# Patient Record
Sex: Female | Born: 1970
Health system: Southern US, Community
[De-identification: ages and names within clinical notes are randomized; demographics above are authoritative.]

## PROBLEM LIST (undated history)

## (undated) DIAGNOSIS — F418 Other specified anxiety disorders: Secondary | ICD-10-CM

## (undated) DIAGNOSIS — Z8489 Family history of other specified conditions: Secondary | ICD-10-CM

## (undated) DIAGNOSIS — T7840XA Allergy, unspecified, initial encounter: Secondary | ICD-10-CM

## (undated) DIAGNOSIS — F329 Major depressive disorder, single episode, unspecified: Secondary | ICD-10-CM

## (undated) DIAGNOSIS — F419 Anxiety disorder, unspecified: Secondary | ICD-10-CM

## (undated) DIAGNOSIS — M797 Fibromyalgia: Secondary | ICD-10-CM

## (undated) DIAGNOSIS — F32A Depression, unspecified: Secondary | ICD-10-CM

## (undated) DIAGNOSIS — K219 Gastro-esophageal reflux disease without esophagitis: Secondary | ICD-10-CM

## (undated) DIAGNOSIS — G709 Myoneural disorder, unspecified: Secondary | ICD-10-CM

## (undated) DIAGNOSIS — G43909 Migraine, unspecified, not intractable, without status migrainosus: Secondary | ICD-10-CM

## (undated) HISTORY — DX: Myoneural disorder, unspecified: G70.9

## (undated) HISTORY — DX: Allergy, unspecified, initial encounter: T78.40XA

## (undated) HISTORY — DX: Major depressive disorder, single episode, unspecified: F32.9

## (undated) HISTORY — DX: Depression, unspecified: F32.A

## (undated) HISTORY — PX: OTHER SURGICAL HISTORY: SHX169

## (undated) HISTORY — PX: TUBAL LIGATION: SHX77

---

## 2017-01-23 ENCOUNTER — Encounter: Payer: Self-pay | Admitting: *Deleted

## 2017-01-23 ENCOUNTER — Ambulatory Visit
Admission: EM | Admit: 2017-01-23 | Discharge: 2017-01-23 | Disposition: A | Discharge status: Home / Self Care | Payer: Self-pay | Attending: Family Medicine | Admitting: Family Medicine

## 2017-01-23 DIAGNOSIS — R112 Nausea with vomiting, unspecified: Secondary | ICD-10-CM

## 2017-01-23 DIAGNOSIS — S161XXA Strain of muscle, fascia and tendon at neck level, initial encounter: Secondary | ICD-10-CM

## 2017-01-23 HISTORY — DX: Other specified anxiety disorders: F41.8

## 2017-01-23 HISTORY — DX: Gastro-esophageal reflux disease without esophagitis: K21.9

## 2017-01-23 MED ORDER — KETOROLAC TROMETHAMINE 60 MG/2ML IM SOLN
60.0000 mg | Freq: Once | INTRAMUSCULAR | Status: AC
  Administered 2017-01-23: 60 mg via INTRAMUSCULAR

## 2017-01-23 MED ORDER — TIZANIDINE HCL 4 MG PO CAPS: 4.0000 mg | ORAL_CAPSULE | Freq: Three times a day (TID) | ORAL | 0 refills | Status: DC

## 2017-01-23 MED ORDER — ONDANSETRON 8 MG PO TBDP: 8.0000 mg | ORAL_TABLET | Freq: Two times a day (BID) | ORAL | 0 refills | Status: DC

## 2017-01-23 MED ORDER — NAPROXEN 500 MG PO TABS
500.0000 mg | ORAL_TABLET | Freq: Two times a day (BID) | ORAL | 0 refills | Status: DC
Start: 2017-01-23 — End: 2017-06-16

## 2017-01-23 NOTE — ED Provider Notes (Signed)
[facility]    CSN: [id number] Arrival date & time: 01/23/17  1058     History   Chief Complaint Chief Complaint  Patient presents with  . Neck Pain  . Shoulder Pain    HPI Heather Costa is a 46 y.o. female.   HPI  Is a 46 year old female who presents with constant neck and shoulder pain for the last 2 weeks. He has no known injury. She her husband have recently moved here from [place] currently living in a camper on a campground. He has been down here now for 2 months. She does not think that sleeping in the camper is the cause of her neck and shoulder pain. He denies any upper extremity radicular symptoms. He has been diagnosed with fibromyalgia but was not taking the Lyrica due to financial concerns  In addition to the above she has been having nausea and vomiting for the last 2 weeks. She's had a bilateral tubal ligation in the past. Last episode of nausea and vomiting was yesterday. She states that he get very between 5 times a day to 1 times a day. She has a history of chronic diarrhea has had for years and has been worked up by GI in the past. Tries to maintain her hydration is much as possible.        Past Medical History:  Diagnosis Date  . Depression with anxiety   . GERD (gastroesophageal reflux disease)     There are no active problems to display for this patient.   Past Surgical History:  Procedure Laterality Date  . TUBAL LIGATION      OB History    No data available       Home Medications    Prior to Admission medications   Medication Sig Start Date End Date Taking? Authorizing Provider  busPIRone (BUSPAR) 10 MG tablet Take 10 mg by mouth 2 (two) times daily.   Yes [provider]  citalopram (CELEXA) 20 MG tablet Take 20 mg by mouth daily.   Yes [provider]  omeprazole (PRILOSEC) 20 MG capsule Take 20 mg by mouth daily.   Yes [provider]  naproxen (NAPROSYN) 500 MG tablet Take 1 tablet  (500 mg total) by mouth 2 (two) times daily with a meal. 01/23/17   Lutricia Feil, PA-C  ondansetron (ZOFRAN ODT) 8 MG disintegrating tablet Take 1 tablet (8 mg total) by mouth 2 (two) times daily. 01/23/17   Lutricia Feil, PA-C  tiZANidine (ZANAFLEX) 4 MG capsule Take 1 capsule (4 mg total) by mouth 3 (three) times daily. 01/23/17   Lutricia Feil, PA-C    Family History Family History  Problem Relation Age of Onset  . Healthy Mother   . Healthy Father     Social History Social History  Substance Use Topics  . Smoking status: Former Games developer  . Smokeless tobacco: Never Used  . Alcohol use Yes     Allergies   Sulfa antibiotics   Review of Systems Review of Systems  Constitutional: Positive for activity change. Negative for appetite change, chills, fatigue and fever.  Musculoskeletal: Positive for neck pain and neck stiffness.  All other systems reviewed and are negative.    Physical Exam Triage Vital Signs ED Triage Vitals  Enc Vitals Group     BP 01/23/17 1135 (!) 143/70     Pulse Rate 01/23/17 1135 (!) 58     Resp 01/23/17 1135 16     Temp 01/23/17  1135 98 F (36.7 C)     Temp Source 01/23/17 1135 Oral     SpO2 01/23/17 1135 99 %     Weight 01/23/17 1137 143 lb (64.9 kg)     Height 01/23/17 1137 4' 9.5" (1.461 m)     Head Circumference --      Peak Flow --      Pain Score 01/23/17 1137 9     Pain Loc --      Pain Edu? --      Excl. in GC? --    No data found.   Updated Vital Signs BP (!) 143/70 (BP Location: Left Arm)   Pulse (!) 58   Temp 98 F (36.7 C) (Oral)   Resp 16   Ht 4' 9.5" (1.461 m)   Wt 143 lb (64.9 kg)   LMP 01/21/2017   SpO2 99%   BMI 30.41 kg/m   Visual Acuity Right Eye Distance:   Left Eye Distance:   Bilateral Distance:    Right Eye Near:   Left Eye Near:    Bilateral Near:     Physical Exam  Constitutional: She is oriented to person, place, and time. She appears well-developed and well-nourished. No distress.    HENT:  Head: Normocephalic.  Eyes: Pupils are equal, round, and reactive to light.  Neck: Normal range of motion. Neck supple.  Examination of the cervical spine showed limitation of motion particularly to flexion and leftward flexion and extension. Patient is also decreased. She has tenderness to palpation along the paraspinous muscles and into the trapezii bilaterally. Upper extremity strength is intact to testing. Sensation is intact to light touch throughout. DTRs are 2+ over 4 in the upper extremities.  Musculoskeletal: Normal range of motion.  Lymphadenopathy:    She has no cervical adenopathy.  Neurological: She is alert and oriented to person, place, and time.  Skin: Skin is warm and dry. She is not diaphoretic.  Psychiatric: She has a normal mood and affect. Her behavior is normal. Judgment and thought content normal.  Nursing note and vitals reviewed.    UC Treatments / Results  Labs (all labs ordered are listed, but only abnormal results are displayed) Labs Reviewed - No data to display  EKG  EKG Interpretation None       Radiology No results found.  Procedures Procedures (including critical care time)  Medications Ordered in UC Medications  ketorolac (TORADOL) injection 60 mg (60 mg Intramuscular Given 01/23/17 1246)     Initial Impression / Assessment and Plan / UC Course  I have reviewed the triage vital signs and the nursing notes.  Pertinent labs & imaging results that were available during my care of the patient were reviewed by me and considered in my medical decision making (see chart for details).     Plan: 1. Test/x-ray results and diagnosis reviewed with patient 2. rx as per orders; risks, benefits, potential side effects reviewed with patient 3. Recommend supportive treatment with rest and symptom avoidance. Uses Zanaflex 4 hours muscle spasm. She was cautioned regarding activities requiring concentration and judgment and was warned not to  drive taking the muscle relaxer. Since she is new to the area she'll need to find a primary care physician as well as a GYN and GI physician. Given the names but should check with her insurance carrier so she doesn't incur out-of-pocket expenses. If she is not improving she should follow-up in our clinic or hopefully she'll obtain the name of a  primary care. 4. F/u prn if symptoms worsen or don't improve   Final Clinical Impressions(s) / UC Diagnoses   Final diagnoses:  Acute strain of neck muscle, initial encounter    New Prescriptions Discharge Medication List as of 01/23/2017 12:42 PM    START taking these medications   Details  naproxen (NAPROSYN) 500 MG tablet Take 1 tablet (500 mg total) by mouth 2 (two) times daily with a meal., Starting Mon 01/23/2017, Normal    ondansetron (ZOFRAN ODT) 8 MG disintegrating tablet Take 1 tablet (8 mg total) by mouth 2 (two) times daily., Starting Mon 01/23/2017, Normal    tiZANidine (ZANAFLEX) 4 MG capsule Take 1 capsule (4 mg total) by mouth 3 (three) times daily., Starting Mon 01/23/2017, Normal         Controlled Substance Prescriptions [place] Controlled Substance Registry consulted? Not Applicable   Lutricia Feil, PA-C 01/23/17 1307

## 2017-01-23 NOTE — ED Triage Notes (Signed)
Patient started having constant neck and shoulder pain 2 weeks ago. Mechanism of injury unknown.  Patient reports nausea and vomiting for 2 weeks, last episode yesterday. Patient reports constant diarrhea for years.

## 2017-04-21 ENCOUNTER — Ambulatory Visit (INDEPENDENT_AMBULATORY_CARE_PROVIDER_SITE_OTHER): Payer: 59 | Admitting: Family Medicine

## 2017-04-21 ENCOUNTER — Encounter: Payer: Self-pay | Admitting: Family Medicine

## 2017-04-21 VITALS — BP 131/85 | HR 88 | Temp 99.3°F | Ht 59.0 in | Wt 147.8 lb

## 2017-04-21 DIAGNOSIS — Z7689 Persons encountering health services in other specified circumstances: Secondary | ICD-10-CM | POA: Diagnosis not present

## 2017-04-21 DIAGNOSIS — M797 Fibromyalgia: Secondary | ICD-10-CM | POA: Diagnosis not present

## 2017-04-21 DIAGNOSIS — F419 Anxiety disorder, unspecified: Secondary | ICD-10-CM | POA: Diagnosis not present

## 2017-04-21 DIAGNOSIS — K582 Mixed irritable bowel syndrome: Secondary | ICD-10-CM

## 2017-04-21 DIAGNOSIS — F331 Major depressive disorder, recurrent, moderate: Secondary | ICD-10-CM | POA: Diagnosis not present

## 2017-04-21 MED ORDER — HYDROXYZINE HCL 25 MG PO TABS: 25.0000 mg | ORAL_TABLET | Freq: Three times a day (TID) | ORAL | 1 refills | Status: DC | PRN

## 2017-04-21 MED ORDER — PREGABALIN 25 MG PO CAPS: 25.0000 mg | ORAL_CAPSULE | Freq: Two times a day (BID) | ORAL | 0 refills | Status: DC

## 2017-04-21 MED ORDER — BACLOFEN 10 MG PO TABS: 10.0000 mg | ORAL_TABLET | Freq: Three times a day (TID) | ORAL | 1 refills | Status: DC | PRN

## 2017-04-21 MED ORDER — CITALOPRAM HYDROBROMIDE 20 MG PO TABS: 20.0000 mg | ORAL_TABLET | Freq: Every day | ORAL | 1 refills | Status: DC

## 2017-04-21 NOTE — Progress Notes (Signed)
BP 131/85 (BP Location: Left Arm, Patient Position: Sitting, Cuff Size: Normal)   Pulse 88   Temp 99.3 F (37.4 C) (Temporal)   Ht 4\' 11"  (1.499 m)   Wt 147 lb 12.8 oz (67 kg)   SpO2 98%   BMI 29.85 kg/m    Subjective:    Patient ID: Heather Levineammy Maddison, female    DOB: February 21, 1971, 47 y.o.   MRN: [id number]  HPI: Heather Costa is a 47 y.o. female  Chief Complaint  Patient presents with  . Establish Care  . Fibromyalgia    Patient states she was diagnosed with Fibromyalgia. Patient states she's in severe pain all the time.   . Bowel Problem    Patient states that she has had colonoscopy, endosocpy for bowel issues. Patient states she'll have days of diarrhea or constipation. Patient states she thinks it's her gallbladder.   Pt here today to establish care.   Diagnosed 3 years ago with Fibromyalgia, pain is severe the past year. States it's from the shoulder blades up. Flares are more frequent and longer lasting lately. Tried gabapentin right off after diagnosis and states it made her a zombie. Could not afford lyrica. Tried acupuncture with fair temporary relief.   Hx of depression, on celexa. Not on buspar but is supposed to be. States it didn't help her so she weaned off of it. Has been on celexa for about 2 years now, working well for her. Denies SI/HI. Still having significant anxiety issues, feels her pain aids in that.   Bouts of continuous diarrhea and then constipation. Has had full workup through GI in the last few years with upper endoscopy, colonoscopy, and many lab tests all without known cause. Currently, sh manages with stool softeners during constipation. Tries not to treat diarrhea as it brings on the constipation worse. Used to be careful about her diet but has not done so in quite a while.   Last CPE was about a year ago in April.   Past Medical History:  Diagnosis Date  . Allergy   . Depression   . Depression with anxiety   . GERD (gastroesophageal reflux  disease)   . Neuromuscular disorder [facility](HCC)    Social History   Socioeconomic History  . Marital status: Married    Spouse name: Not on file  . Number of children: Not on file  . Years of education: Not on file  . Highest education level: Not on file  Social Needs  . Financial resource strain: Not on file  . Food insecurity - worry: Not on file  . Food insecurity - inability: Not on file  . Transportation needs - medical: Not on file  . Transportation needs - non-medical: Not on file  Occupational History  . Not on file  Tobacco Use  . Smoking status: Current Every Day Smoker    Types: Cigarettes  . Smokeless tobacco: Never Used  Substance and Sexual Activity  . Alcohol use: Yes  . Drug use: No  . Sexual activity: Not on file  Other Topics Concern  . Not on file  Social History Narrative  . Not on file    Relevant past medical, surgical, family and social history reviewed and updated as indicated. Interim medical history since our last visit reviewed. Allergies and medications reviewed and updated.  Review of Systems  Constitutional: Negative.   HENT: Negative.   Respiratory: Negative.   Cardiovascular: Negative.   Gastrointestinal: Positive for abdominal pain, constipation and diarrhea.  Genitourinary:  Negative.   Musculoskeletal: Positive for arthralgias and myalgias.  Skin: Negative.   Neurological: Negative.   Psychiatric/Behavioral: Positive for dysphoric mood. The patient is nervous/anxious.    Per HPI unless specifically indicated above     Objective:    BP 131/85 (BP Location: Left Arm, Patient Position: Sitting, Cuff Size: Normal)   Pulse 88   Temp 99.3 F (37.4 C) (Temporal)   Ht 4\' 11"  (1.499 m)   Wt 147 lb 12.8 oz (67 kg)   SpO2 98%   BMI 29.85 kg/m   Wt Readings from Last 3 Encounters:  04/21/17 147 lb 12.8 oz (67 kg)  01/23/17 143 lb (64.9 kg)    Physical Exam  Constitutional: She is oriented to person, place, and time. She appears  well-developed and well-nourished. No distress.  HENT:  Head: Atraumatic.  Eyes: Conjunctivae are normal. Pupils are equal, round, and reactive to light.  Neck: Normal range of motion. Neck supple.  Cardiovascular: Normal rate and normal heart sounds.  Pulmonary/Chest: Effort normal and breath sounds normal.  Abdominal: Soft. Bowel sounds are normal. She exhibits no distension. There is no tenderness.  Musculoskeletal: Normal range of motion.  Neurological: She is alert and oriented to person, place, and time.  Skin: Skin is warm and dry.  Psychiatric: She has a normal mood and affect. Her behavior is normal.  Nursing note and vitals reviewed.  No results found for this or any previous visit.    Assessment & Plan:   Problem List Items Addressed This Visit      Other   Depression - Primary    Stable on celexa, continue current regimen      Relevant Medications   hydrOXYzine (ATARAX/VISTARIL) 25 MG tablet   citalopram (CELEXA) 20 MG tablet   Fibromyalgia    Will try getting lyrica covered again as she's failed gabapentin, acupuncture, OTC pain relievers, muscle relaxers. Discussed that we will transition her from celexa to cymbalta if not covered to try and target her neuropathic pain in addition to depression.       Anxiety    Pt open to trying hydroxyzine prn instead of the buspar as it didn't help her at all. Strongly recommended counseling in addition, list given      Relevant Medications   hydrOXYzine (ATARAX/VISTARIL) 25 MG tablet   citalopram (CELEXA) 20 MG tablet    Other Visit Diagnoses    Encounter to establish care       Irritable bowel syndrome with both constipation and diarrhea       Suspect her GI sxs are mixed IBS. probiotics, trials of cutting out certain food groups, learn triggers. Tx symptomatically. Pt not wanting to see GI currently       Follow up plan: Return in about 4 weeks (around 05/19/2017) for Fibromyalgia f/u, anxiety.

## 2017-04-24 DIAGNOSIS — M797 Fibromyalgia: Secondary | ICD-10-CM | POA: Insufficient documentation

## 2017-04-24 DIAGNOSIS — F419 Anxiety disorder, unspecified: Secondary | ICD-10-CM | POA: Insufficient documentation

## 2017-04-24 DIAGNOSIS — F329 Major depressive disorder, single episode, unspecified: Secondary | ICD-10-CM | POA: Insufficient documentation

## 2017-04-24 DIAGNOSIS — F32A Depression, unspecified: Secondary | ICD-10-CM | POA: Insufficient documentation

## 2017-04-24 NOTE — Assessment & Plan Note (Signed)
Pt open to trying hydroxyzine prn instead of the buspar as it didn't help her at all. Strongly recommended counseling in addition, list given

## 2017-04-24 NOTE — Assessment & Plan Note (Signed)
Will try getting lyrica covered again as she's failed gabapentin, acupuncture, OTC pain relievers, muscle relaxers. Discussed that we will transition her from celexa to cymbalta if not covered to try and target her neuropathic pain in addition to depression.

## 2017-04-24 NOTE — Assessment & Plan Note (Signed)
Stable on celexa, continue current regimen 

## 2017-04-24 NOTE — Patient Instructions (Signed)
Follow up in 1 month   

## 2017-05-01 ENCOUNTER — Encounter: Payer: Self-pay | Admitting: Family Medicine

## 2017-05-02 ENCOUNTER — Other Ambulatory Visit: Payer: Self-pay | Admitting: Family Medicine

## 2017-05-02 MED ORDER — METHOCARBAMOL 500 MG PO TABS: 500.0000 mg | ORAL_TABLET | Freq: Three times a day (TID) | ORAL | 0 refills | Status: DC

## 2017-05-19 ENCOUNTER — Ambulatory Visit (INDEPENDENT_AMBULATORY_CARE_PROVIDER_SITE_OTHER): Payer: 59 | Admitting: Family Medicine

## 2017-05-19 ENCOUNTER — Encounter: Payer: Self-pay | Admitting: Family Medicine

## 2017-05-19 VITALS — BP 134/84 | HR 83 | Wt 144.8 lb

## 2017-05-19 DIAGNOSIS — M797 Fibromyalgia: Secondary | ICD-10-CM

## 2017-05-19 DIAGNOSIS — F419 Anxiety disorder, unspecified: Secondary | ICD-10-CM | POA: Diagnosis not present

## 2017-05-19 DIAGNOSIS — G43109 Migraine with aura, not intractable, without status migrainosus: Secondary | ICD-10-CM

## 2017-05-19 DIAGNOSIS — F331 Major depressive disorder, recurrent, moderate: Secondary | ICD-10-CM

## 2017-05-19 MED ORDER — SUMATRIPTAN SUCCINATE 50 MG PO TABS: 50.0000 mg | ORAL_TABLET | ORAL | 0 refills | Status: DC | PRN

## 2017-05-19 MED ORDER — PREGABALIN 25 MG PO CAPS: 25.0000 mg | ORAL_CAPSULE | Freq: Two times a day (BID) | ORAL | 0 refills | Status: DC

## 2017-05-19 MED ORDER — CLONAZEPAM 0.5 MG PO TABS: 0.5000 mg | ORAL_TABLET | Freq: Every day | ORAL | 0 refills | Status: DC | PRN

## 2017-05-19 MED ORDER — CITALOPRAM HYDROBROMIDE 40 MG PO TABS: 40.0000 mg | ORAL_TABLET | Freq: Every day | ORAL | 1 refills | Status: DC

## 2017-05-19 NOTE — Progress Notes (Signed)
BP 134/84   Pulse 83   Wt 144 lb 12.8 oz (65.7 kg)   SpO2 98%   BMI 29.25 kg/m    Subjective:    [name], female    DOB: [birth year], 47 y.o.   MRN: [id number]  HPI: Heather Costa is a 47 y.o. female  Chief Complaint  Patient presents with  . Follow-up  . Fibromyalgia    Same  . Anxiety    Same.    Pt here today for 1 month f/u fibromyalgia, depression, and anxiety. Feels her celexa is going well as far as her dysphoric moods. No SI/HI, feels it's working much better than the other anti depressants she's been on in the past.   Never got to start the lyrica for her fibromyalgia, was unsure if she was waiting for insurance approval or what. Still having significant pain from that. Has now failed cymbalta, amitriptyline, and gabapentin. Does feel the robaxin is the most successful muscle relaxer she's been on to date, but really hoping to try the lyrica. Is not interested in getting on pain medication and hoping to avoid altogether.   Feels the hydroxyzine did not touch her chronic anxiety and panic episodes. Has now failed buspar and hydroxyzine.    Having severe migraines occasionally, used to be on an abortive medications but can't remember what. Severe migraines over weekend and into Monday and Tuesday. Visual issus with them, but no N/V. States they run in the family.   Past Medical History:  Diagnosis Date  . Allergy   . Depression   . Depression with anxiety   . GERD (gastroesophageal reflux disease)   . Neuromuscular disorder [facility])    Social History   Socioeconomic History  . Marital status: Married    Spouse name: Not on file  . Number of children: Not on file  . Years of education: Not on file  . Highest education level: Not on file  Social Needs  . Financial resource strain: Not on file  . Food insecurity - worry: Not on file  . Food insecurity - inability: Not on file  . Transportation needs - medical: Not on file  . Transportation  needs - non-medical: Not on file  Occupational History  . Not on file  Tobacco Use  . Smoking status: Current Every Day Smoker    Types: Cigarettes  . Smokeless tobacco: Never Used  Substance and Sexual Activity  . Alcohol use: Yes  . Drug use: No  . Sexual activity: Not on file  Other Topics Concern  . Not on file  Social History Narrative  . Not on file   Relevant past medical, surgical, family and social history reviewed and updated as indicated. Interim medical history since our last visit reviewed. Allergies and medications reviewed and updated.  Review of Systems  Constitutional: Negative.   HENT: Negative.   Eyes: Positive for visual disturbance (during migraines).  Respiratory: Negative.   Cardiovascular: Negative.   Gastrointestinal: Negative.   Genitourinary: Negative.   Musculoskeletal: Positive for arthralgias and myalgias.  Skin: Negative.   Neurological: Positive for headaches.  Psychiatric/Behavioral: Positive for dysphoric mood. The patient is nervous/anxious.    Per HPI unless specifically indicated above     Objective:    BP 134/84   Pulse 83   Wt 144 lb 12.8 oz (65.7 kg)   SpO2 98%   BMI 29.25 kg/m   Wt Readings from Last 3 Encounters:  05/19/17 144 lb 12.8 oz (65.7  kg)  04/21/17 147 lb 12.8 oz (67 kg)  01/23/17 143 lb (64.9 kg)    Physical Exam  Constitutional: She is oriented to person, place, and time. She appears well-developed and well-nourished. No distress.  HENT:  Head: Atraumatic.  Eyes: Conjunctivae are normal. Pupils are equal, round, and reactive to light.  Neck: Normal range of motion. Neck supple.  Pulmonary/Chest: Breath sounds normal. No respiratory distress.  Musculoskeletal: Normal range of motion.  Neurological: She is alert and oriented to person, place, and time.  Skin: Skin is warm and dry.  Psychiatric: She has a normal mood and affect. Her behavior is normal. Judgment and thought content normal.  Nursing note and  vitals reviewed.  No results found for this or any previous visit.    Assessment & Plan:   Problem List Items Addressed This Visit      Cardiovascular and Mediastinum   Migraine    Will send imitrex to take at first sign of migraine coming on and get records from previous provider to get the name of her previous regimen which seemed to work well for her. Hoping also once her pain is under better control these will be less common.       Relevant Medications   pregabalin (LYRICA) 25 MG capsule   clonazePAM (KLONOPIN) 0.5 MG tablet   citalopram (CELEXA) 40 MG tablet   SUMAtriptan (IMITREX) 50 MG tablet     Other   Depression    Good control with celexa, will increase to 40 mg and see if this helps some with her difficult to control long-time anxiety. Pt refusing counseling, states it has never helped her in the past. Will monitor closely for benefit      Relevant Medications   citalopram (CELEXA) 40 MG tablet   Fibromyalgia    Will resend lyrica and look into insurance coverage/prior authorization to get that covered. Continue robaxin as needed      Anxiety - Primary    Has had good relief in the past with just prn klonopin, given her failure of multiple other types of anxiety medications will restart rare use of klonopin. Hoping 1 script will last 2+ months. Precautions reviewed at length with pt, who is agreeable      Relevant Medications   citalopram (CELEXA) 40 MG tablet       Follow up plan: Return in about 4 weeks (around 06/16/2017) for Anxiety f/u.

## 2017-05-22 DIAGNOSIS — G43909 Migraine, unspecified, not intractable, without status migrainosus: Secondary | ICD-10-CM | POA: Insufficient documentation

## 2017-05-22 NOTE — Assessment & Plan Note (Signed)
Has had good relief in the past with just prn klonopin, given her failure of multiple other types of anxiety medications will restart rare use of klonopin. Hoping 1 script will last 2+ months. Precautions reviewed at length with pt, who is agreeable

## 2017-05-22 NOTE — Patient Instructions (Signed)
Follow up in 1 month   

## 2017-05-22 NOTE — Assessment & Plan Note (Signed)
Will send imitrex to take at first sign of migraine coming on and get records from previous provider to get the name of her previous regimen which seemed to work well for her. Hoping also once her pain is under better control these will be less common.

## 2017-05-22 NOTE — Assessment & Plan Note (Addendum)
Will resend lyrica and look into insurance coverage/prior authorization to get that covered. Continue robaxin as needed

## 2017-05-22 NOTE — Assessment & Plan Note (Signed)
Good control with celexa, will increase to 40 mg and see if this helps some with her difficult to control long-time anxiety. Pt refusing counseling, states it has never helped her in the past. Will monitor closely for benefit

## 2017-05-23 ENCOUNTER — Telehealth: Payer: Self-pay

## 2017-05-23 NOTE — Telephone Encounter (Signed)
PA for Lyrica. Patient must first try and fail Duloxetine and Gabapentin.

## 2017-05-23 NOTE — Telephone Encounter (Signed)
Pt has failed cymbalta, amitriptyline, and gabapentin in the past

## 2017-05-23 NOTE — Telephone Encounter (Signed)
Okay! Thank you!  They can't find matching DOB/Name in system so I can complete a PA, but that is what they told me was needed for approval.   I will call patient and see what if there's another name/maiden name etc.  Please route back to me so it's in my box.

## 2017-05-25 NOTE — Telephone Encounter (Signed)
Tried calling patient,  No answer. Will try again later.

## 2017-05-31 NOTE — Telephone Encounter (Signed)
Phone went to VM this time. Left VM (DPR reviewed) about information below. Told patient to call us with another insurance card, or if her insurance has changed for PA.  Ok for CRM to give information if patient calls back.

## 2017-06-16 ENCOUNTER — Encounter: Payer: Self-pay | Admitting: Family Medicine

## 2017-06-16 ENCOUNTER — Ambulatory Visit (INDEPENDENT_AMBULATORY_CARE_PROVIDER_SITE_OTHER): Payer: 59 | Admitting: Family Medicine

## 2017-06-16 VITALS — BP 126/79 | HR 71 | Temp 97.9°F | Wt 145.1 lb

## 2017-06-16 DIAGNOSIS — M797 Fibromyalgia: Secondary | ICD-10-CM | POA: Diagnosis not present

## 2017-06-16 DIAGNOSIS — Z23 Encounter for immunization: Secondary | ICD-10-CM | POA: Diagnosis not present

## 2017-06-16 DIAGNOSIS — F330 Major depressive disorder, recurrent, mild: Secondary | ICD-10-CM | POA: Diagnosis not present

## 2017-06-16 DIAGNOSIS — F419 Anxiety disorder, unspecified: Secondary | ICD-10-CM | POA: Diagnosis not present

## 2017-06-16 MED ORDER — OMEPRAZOLE 40 MG PO CPDR: 40.0000 mg | DELAYED_RELEASE_CAPSULE | Freq: Every day | ORAL | 1 refills | Status: DC

## 2017-06-16 MED ORDER — METHOCARBAMOL 500 MG PO TABS: 500.0000 mg | ORAL_TABLET | Freq: Three times a day (TID) | ORAL | 1 refills | Status: DC

## 2017-06-16 MED ORDER — NAPROXEN 500 MG PO TABS: 500.0000 mg | ORAL_TABLET | Freq: Two times a day (BID) | ORAL | 3 refills | Status: DC

## 2017-06-16 NOTE — Progress Notes (Signed)
BP 126/79 (BP Location: Left Arm, Patient Position: Sitting, Cuff Size: Normal)   Pulse 71   Temp 97.9 F (36.6 C) (Oral)   Wt 145 lb 1.6 oz (65.8 kg)   SpO2 98%   BMI 29.31 kg/m    Subjective:    [name], female    DOB: November 13, 1970, 47 y.o.   MRN: [id number]  HPI: Heather Costa is a 47 y.o. female  Chief Complaint  Patient presents with  . Anxiety    Patient states she is much better. Celexa seems to helping.   Pt presents today for anxiety and fibromyalgia f/u.   Feeling great with current celexa dose, states it helps significantly. Has been able to use her klonopin very sparingly since increasing celexa dose. Still has 22 of 30 left from original script. Denies SI/HI, side effects, manic episodes. Very happy with where things are at in that regard.  Still having significant pain from fibromyalgia, continues to await insurance approval for lyrica as she's failed the other possible tx's. Robaxin helping minimally in meantime.   Past Medical History:  Diagnosis Date  . Allergy   . Depression   . Depression with anxiety   . GERD (gastroesophageal reflux disease)   . Neuromuscular disorder [facility])    Social History   Socioeconomic History  . Marital status: Married    Spouse name: Not on file  . Number of children: Not on file  . Years of education: Not on file  . Highest education level: Not on file  Social Needs  . Financial resource strain: Not on file  . Food insecurity - worry: Not on file  . Food insecurity - inability: Not on file  . Transportation needs - medical: Not on file  . Transportation needs - non-medical: Not on file  Occupational History  . Not on file  Tobacco Use  . Smoking status: Current Every Day Smoker    Types: Cigarettes  . Smokeless tobacco: Never Used  Substance and Sexual Activity  . Alcohol use: Yes  . Drug use: No  . Sexual activity: Not on file  Other Topics Concern  . Not on file  Social History  Narrative  . Not on file    Relevant past medical, surgical, family and social history reviewed and updated as indicated. Interim medical history since our last visit reviewed. Allergies and medications reviewed and updated.  Review of Systems  Per HPI unless specifically indicated above     Objective:    BP 126/79 (BP Location: Left Arm, Patient Position: Sitting, Cuff Size: Normal)   Pulse 71   Temp 97.9 F (36.6 C) (Oral)   Wt 145 lb 1.6 oz (65.8 kg)   SpO2 98%   BMI 29.31 kg/m   Wt Readings from Last 3 Encounters:  06/16/17 145 lb 1.6 oz (65.8 kg)  05/19/17 144 lb 12.8 oz (65.7 kg)  04/21/17 147 lb 12.8 oz (67 kg)    Physical Exam  Constitutional: She is oriented to person, place, and time. She appears well-developed and well-nourished. No distress.  HENT:  Head: Atraumatic.  Eyes: Conjunctivae are normal. Pupils are equal, round, and reactive to light.  Neck: Normal range of motion. Neck supple.  Cardiovascular: Normal rate and normal heart sounds.  Pulmonary/Chest: Effort normal and breath sounds normal. No respiratory distress.  Musculoskeletal: Normal range of motion.  Neurological: She is alert and oriented to person, place, and time.  Skin: Skin is warm and dry.  Psychiatric: She  has a normal mood and affect. Her behavior is normal.  Nursing note and vitals reviewed.  No results found for this or any previous visit.    Assessment & Plan:   Problem List Items Addressed This Visit      Other   Depression    Significant improvement with celexa at 40 mg. Continue current regimen      Fibromyalgia - Primary    Still awaiting approval for lyrica. Will continue to look into that. In meantime, continue robaxin, restart naproxen BID prn      Anxiety    Stable on prn klonopin. Continue current regimen.        Other Visit Diagnoses    Need for Td vaccine       Relevant Orders   Td vaccine greater than or equal to 7yo preservative free IM (Completed)         Follow up plan: Return for CPE.

## 2017-06-16 NOTE — Patient Instructions (Addendum)
Td Vaccine (Tetanus and Diphtheria): What You Need to Know 1. Why get vaccinated? Tetanus  and diphtheria are very serious diseases. They are rare in the [place] today, but people who do become infected often have severe complications. Td vaccine is used to protect adolescents and adults from both of these diseases. Both tetanus and diphtheria are infections caused by bacteria. Diphtheria spreads from person to person through coughing or sneezing. Tetanus-causing bacteria enter the body through cuts, scratches, or wounds. TETANUS (lockjaw) causes painful muscle tightening and stiffness, usually all over the body.  It can lead to tightening of muscles in the head and neck so you can't open your mouth, swallow, or sometimes even breathe. Tetanus kills about 1 out of every 10 people who are infected even after receiving the best medical care.  DIPHTHERIA can cause a thick coating to form in the back of the throat.  It can lead to breathing problems, paralysis, heart failure, and death.  Before vaccines, as many as 200,000 cases of diphtheria and hundreds of cases of tetanus were reported in the [place] each year. Since vaccination began, reports of cases for both diseases have dropped by about 99%. 2. Td vaccine Td vaccine can protect adolescents and adults from tetanus and diphtheria. Td is usually given as a booster dose every 10 years but it can also be given earlier after a severe and dirty wound or burn. Another vaccine, called Tdap, which protects against pertussis in addition to tetanus and diphtheria, is sometimes recommended instead of Td vaccine. Your doctor or the person giving you the vaccine can give you more information. Td may safely be given at the same time as other vaccines. 3. Some people should not get this vaccine  A person who has ever had a life-threatening allergic reaction after a previous dose of any tetanus or diphtheria containing vaccine, OR has a severe  allergy to any part of this vaccine, should not get Td vaccine. Tell the person giving the vaccine about any severe allergies.  Talk to your doctor if you: ? had severe pain or swelling after any vaccine containing diphtheria or tetanus, ? ever had a condition called Guillain Barre Syndrome (GBS), ? aren't feeling well on the day the shot is scheduled. 4. What are the risks from Td vaccine? With any medicine, including vaccines, there is a chance of side effects. These are usually mild and go away on their own. Serious reactions are also possible but are rare. Most people who get Td vaccine do not have any problems with it. Mild problems following Td vaccine: (Did not interfere with activities)  Pain where the shot was given (about 8 people in 10)  Redness or swelling where the shot was given (about 1 person in 4)  Mild fever (rare)  Headache (about 1 person in 4)  Tiredness (about 1 person in 4)  Moderate problems following Td vaccine: (Interfered with activities, but did not require medical attention)  Fever over 102F (rare)  Severe problems following Td vaccine: (Unable to perform usual activities; required medical attention)  Swelling, severe pain, bleeding and/or redness in the arm where the shot was given (rare).  Problems that could happen after any vaccine:  People sometimes faint after a medical procedure, including vaccination. Sitting or lying down for about 15 minutes can help prevent fainting, and injuries caused by a fall. Tell your doctor if you feel dizzy, or have vision changes or ringing in the ears.  Some people get   severe pain in the shoulder and have difficulty moving the arm where a shot was given. This happens very rarely.  Any medication can cause a severe allergic reaction. Such reactions from a vaccine are very rare, estimated at fewer than 1 in a million doses, and would happen within a few minutes to a few hours after the vaccination. As with any  medicine, there is a very remote chance of a vaccine causing a serious injury or death. The safety of vaccines is always being monitored. For more information, visit: www.cdc.gov/vaccinesafety/ 5. What if there is a serious reaction? What should I look for? Look for anything that concerns you, such as signs of a severe allergic reaction, very high fever, or unusual behavior. Signs of a severe allergic reaction can include hives, swelling of the face and throat, difficulty breathing, a fast heartbeat, dizziness, and weakness. These would usually start a few minutes to a few hours after the vaccination. What should I do?  If you think it is a severe allergic reaction or other emergency that can't wait, call 9-1-1 or get the person to the nearest hospital. Otherwise, call your doctor.  Afterward, the reaction should be reported to the Vaccine Adverse Event Reporting System (VAERS). Your doctor might file this report, or you can do it yourself through the VAERS web site at www.vaers.hhs.gov, or by calling 1-800-822-7967. ? VAERS does not give medical advice. 6. The National Vaccine Injury Compensation Program The National Vaccine Injury Compensation Program (VICP) is a federal program that was created to compensate people who may have been injured by certain vaccines. Persons who believe they may have been injured by a vaccine can learn about the program and about filing a claim by calling 1-800-338-2382 or visiting the VICP website at www.hrsa.gov/vaccinecompensation. There is a time limit to file a claim for compensation. 7. How can I learn more?  Ask your doctor. He or she can give you the vaccine package insert or suggest other sources of information.  Call your local or state health department.  Contact the Centers for Disease Control and Prevention (CDC): ? Call 1-800-232-4636 (1-800-CDC-INFO) ? Visit CDC's website at www.cdc.gov/vaccines CDC Td Vaccine VIS (07/28/15) This information is  not intended to replace advice given to you by your health care provider. Make sure you discuss any questions you have with your health care provider. Document Released: 01/30/2006 Document Revised: 12/24/2015 Document Reviewed: 12/24/2015 Elsevier Interactive Patient Education  2017 Elsevier Inc.  

## 2017-06-19 NOTE — Assessment & Plan Note (Signed)
Significant improvement with celexa at 40 mg. Continue current regimen

## 2017-06-19 NOTE — Assessment & Plan Note (Signed)
Stable on prn klonopin. Continue current regimen.

## 2017-06-19 NOTE — Assessment & Plan Note (Signed)
Still awaiting approval for lyrica. Will continue to look into that. In meantime, continue robaxin, restart naproxen BID prn

## 2017-08-16 ENCOUNTER — Encounter: Payer: 59 | Admitting: Family Medicine

## 2017-08-17 ENCOUNTER — Encounter: Payer: Self-pay | Admitting: Family Medicine

## 2017-08-17 ENCOUNTER — Ambulatory Visit (INDEPENDENT_AMBULATORY_CARE_PROVIDER_SITE_OTHER): Payer: 59 | Admitting: Family Medicine

## 2017-08-17 VITALS — BP 127/85 | HR 82 | Temp 98.5°F | Ht 59.0 in | Wt 141.4 lb

## 2017-08-17 DIAGNOSIS — F419 Anxiety disorder, unspecified: Secondary | ICD-10-CM

## 2017-08-17 DIAGNOSIS — G43109 Migraine with aura, not intractable, without status migrainosus: Secondary | ICD-10-CM

## 2017-08-17 DIAGNOSIS — M797 Fibromyalgia: Secondary | ICD-10-CM | POA: Diagnosis not present

## 2017-08-17 DIAGNOSIS — Z0001 Encounter for general adult medical examination with abnormal findings: Secondary | ICD-10-CM

## 2017-08-17 DIAGNOSIS — Z0000 Encounter for general adult medical examination without abnormal findings: Secondary | ICD-10-CM

## 2017-08-17 DIAGNOSIS — Z114 Encounter for screening for human immunodeficiency virus [HIV]: Secondary | ICD-10-CM

## 2017-08-17 DIAGNOSIS — Z124 Encounter for screening for malignant neoplasm of cervix: Secondary | ICD-10-CM

## 2017-08-17 DIAGNOSIS — F330 Major depressive disorder, recurrent, mild: Secondary | ICD-10-CM

## 2017-08-17 DIAGNOSIS — Z1239 Encounter for other screening for malignant neoplasm of breast: Secondary | ICD-10-CM

## 2017-08-17 LAB — UA/M W/RFLX CULTURE, ROUTINE
Bilirubin, UA: NEGATIVE
Glucose, UA: NEGATIVE
KETONES UA: NEGATIVE
Leukocytes, UA: NEGATIVE
NITRITE UA: NEGATIVE
Protein, UA: NEGATIVE
RBC UA: NEGATIVE
UUROB: 0.2 mg/dL (ref 0.2–1.0)
pH, UA: 5 (ref 5.0–7.5)

## 2017-08-17 MED ORDER — QUETIAPINE FUMARATE 50 MG PO TABS: 50.0000 mg | ORAL_TABLET | Freq: Every day | ORAL | 0 refills | Status: DC

## 2017-08-17 MED ORDER — CLONAZEPAM 0.5 MG PO TABS: 0.5000 mg | ORAL_TABLET | Freq: Every day | ORAL | 0 refills | Status: DC | PRN

## 2017-08-17 MED ORDER — GABAPENTIN 300 MG PO CAPS: 300.0000 mg | ORAL_CAPSULE | Freq: Three times a day (TID) | ORAL | 1 refills | Status: DC

## 2017-08-17 NOTE — Progress Notes (Signed)
BP 127/85   Pulse 82   Temp 98.5 F (36.9 C) (Oral)   Ht  (1.499 m)   Wt 141 lb 6.4 oz (64.1 kg)   LMP 08/04/2017 (Exact Date)   SpO2 98%   BMI 28.56 kg/m    Subjective:    [name], female    DOB: 10-05-1970, 47 y.o.   MRN: [id number]  HPI: Heather Costa is a 47 y.o. female presenting on 08/17/2017 for comprehensive medical examination. Current medical complaints include:see below  Main concern today is still hasn't been able to get insurance approval for her lyrica and having significant fibromyalgia pain. As stated in prior notes, has tried and failed amitriptyline, gabapentin, cymbalta in the past and only currently on muscle relaxers and naproxen which help mildly. Wanting something to help with her pain while the lyrica gets sorted.  Still has 6 klonopin left, taking only in severe anxiety situations. Currently having some marital issues so her moods and anxiety have been worse than normal and she's not sleeping as well as she used to. Denies SI/HI, severe mood swings.   She currently lives with: husband Menopausal Symptoms: no  Depression Screen done today and results listed below:  Depression screen [facility] 2/9 08/17/2017  Decreased Interest 0  Down, Depressed, Hopeless 1  PHQ - 2 Score 1  Altered sleeping 1  Tired, decreased energy 1  Change in appetite 1  Feeling bad or failure about yourself  0  Trouble concentrating 0  Moving slowly or fidgety/restless 1  Suicidal thoughts 0  PHQ-9 Score 5    The patient does not have a history of falls. I did not complete a risk assessment for falls. A plan of care for falls was not documented.   Past Medical History:  Past Medical History:  Diagnosis Date  . Allergy   . Depression   . Depression with anxiety   . GERD (gastroesophageal reflux disease)   . Neuromuscular disorder [facility])     Surgical History:  Past Surgical History:  Procedure Laterality Date  . Dysplasia Procedures    . TUBAL  LIGATION      Medications:  Current Outpatient Medications on File Prior to Visit  Medication Sig  . citalopram (CELEXA) 40 MG tablet Take 1 tablet (40 mg total) by mouth daily.  . methocarbamol (ROBAXIN) 500 MG tablet Take 1 tablet (500 mg total) by mouth 3 (three) times daily.  . naproxen (NAPROSYN) 500 MG tablet Take 1 tablet (500 mg total) by mouth 2 (two) times daily with a meal.  . omeprazole (PRILOSEC) 40 MG capsule Take 1 capsule (40 mg total) by mouth daily.  . SUMAtriptan (IMITREX) 50 MG tablet Take 1 tablet (50 mg total) by mouth every 2 (two) hours as needed for migraine. May repeat in 2 hours if headache persists or recurs.  . pregabalin (LYRICA) 25 MG capsule Take 1 capsule (25 mg total) by mouth 2 (two) times daily. (Patient not taking: Reported on 06/16/2017)   No current facility-administered medications on file prior to visit.     Allergies:  Allergies  Allergen Reactions  . Sulfa Antibiotics Anaphylaxis    Social History:  Social History   Socioeconomic History  . Marital status: Married    Spouse name: Not on file  . Number of children: Not on file  . Years of education: Not on file  . Highest education level: Not on file  Occupational History  . Not on file  Social Needs  .  Financial resource strain: Not on file  . Food insecurity:    Worry: Not on file    Inability: Not on file  . Transportation needs:    Medical: Not on file    Non-medical: Not on file  Tobacco Use  . Smoking status: Current Every Day Smoker    Packs/day: 1.00    Types: Cigarettes  . Smokeless tobacco: Never Used  Substance and Sexual Activity  . Alcohol use: Yes  . Drug use: No  . Sexual activity: Not on file  Lifestyle  . Physical activity:    Days per week: Not on file    Minutes per session: Not on file  . Stress: Not on file  Relationships  . Social connections:    Talks on phone: Not on file    Gets together: Not on file    Attends religious service: Not on file     Active member of club or organization: Not on file    Attends meetings of clubs or organizations: Not on file    Relationship status: Not on file  . Intimate partner violence:    Fear of current or ex partner: Not on file    Emotionally abused: Not on file    Physically abused: Not on file    Forced sexual activity: Not on file  Other Topics Concern  . Not on file  Social History Narrative  . Not on file   Social History   Tobacco Use  Smoking Status Current Every Day Smoker  . Packs/day: 1.00  . Types: Cigarettes  Smokeless Tobacco Never Used   Social History   Substance and Sexual Activity  Alcohol Use Yes    Family History:  Family History  Problem Relation Age of Onset  . Healthy Mother   . Healthy Father   . Leukemia Paternal Uncle   . Emphysema Paternal Grandmother   . Emphysema Paternal Grandfather     Past medical history, surgical history, medications, allergies, family history and social history reviewed with patient today and changes made to appropriate areas of the chart.   Review of Systems - General ROS: negative Psychological ROS: positive for - anxiety, depression and sleep disturbances Ophthalmic ROS: negative ENT ROS: negative Breast ROS: negative for breast lumps Respiratory ROS: no cough, shortness of breath, or wheezing Cardiovascular ROS: no chest pain or dyspnea on exertion Gastrointestinal ROS: no abdominal pain, change in bowel habits, or black or bloody stools Genito-Urinary ROS: no dysuria, trouble voiding, or hematuria Musculoskeletal ROS: positive for - joint stiffness and muscle pain Neurological ROS: no TIA or stroke symptoms Dermatological ROS: negative All other ROS negative except what is listed above and in the HPI.      Objective:    BP 127/85   Pulse 82   Temp 98.5 F (36.9 C) (Oral)   Ht 4\' 11"  (1.499 m)   Wt 141 lb 6.4 oz (64.1 kg)   LMP 08/04/2017 (Exact Date)   SpO2 98%   BMI 28.56 kg/m   Wt Readings from Last  3 Encounters:  08/17/17 141 lb 6.4 oz (64.1 kg)  06/16/17 145 lb 1.6 oz (65.8 kg)  05/19/17 144 lb 12.8 oz (65.7 kg)    Physical Exam  Constitutional: She is oriented to person, place, and time. She appears well-developed and well-nourished. No distress.  HENT:  Head: Atraumatic.  Right Ear: External ear normal.  Left Ear: External ear normal.  Nose: Nose normal.  Mouth/Throat: Oropharynx is clear and moist. No  oropharyngeal exudate.  Eyes: Pupils are equal, round, and reactive to light. Conjunctivae are normal. No scleral icterus.  Neck: Normal range of motion. Neck supple. No thyromegaly present.  Cardiovascular: Normal rate, regular rhythm, normal heart sounds and intact distal pulses.  Pulmonary/Chest: Effort normal and breath sounds normal. No respiratory distress. Right breast exhibits no mass, no skin change and no tenderness. Left breast exhibits no mass, no skin change and no tenderness.  Abdominal: Soft. Bowel sounds are normal. She exhibits no mass. There is no tenderness.  Musculoskeletal: Normal range of motion. She exhibits no edema or tenderness.  Lymphadenopathy:    She has no cervical adenopathy.    She has no axillary adenopathy.  Neurological: She is alert and oriented to person, place, and time. No cranial nerve deficit.  Skin: Skin is warm and dry. No rash noted.  Psychiatric: She has a normal mood and affect. Her behavior is normal.  Nursing note and vitals reviewed.   Results for orders placed or performed in visit on 08/17/17  HIV antibody  Result Value Ref Range   HIV Screen 4th Generation wRfx Non Reactive Non Reactive  CBC with Differential/Platelet  Result Value Ref Range   WBC 11.3 (H) 3.4 - 10.8 x10E3/uL   RBC 4.00 3.77 - 5.28 x10E6/uL   Hemoglobin 13.0 11.1 - 15.9 g/dL   Hematocrit 16.1 09.6 - 46.6 %   MCV 98 (H) 79 - 97 fL   MCH 32.5 26.6 - 33.0 pg   MCHC 33.3 31.5 - 35.7 g/dL   RDW 04.5 40.9 - 81.1 %   Platelets 339 150 - 379 x10E3/uL    Neutrophils 79 Not Estab. %   Lymphs 17 Not Estab. %   Monocytes 4 Not Estab. %   Eos 0 Not Estab. %   Basos 0 Not Estab. %   Neutrophils Absolute 8.9 (H) 1.4 - 7.0 x10E3/uL   Lymphocytes Absolute 1.9 0.7 - 3.1 x10E3/uL   Monocytes Absolute 0.4 0.1 - 0.9 x10E3/uL   EOS (ABSOLUTE) 0.1 0.0 - 0.4 x10E3/uL   Basophils Absolute 0.0 0.0 - 0.2 x10E3/uL   Immature Granulocytes 0 Not Estab. %   Immature Grans (Abs) 0.0 0.0 - 0.1 x10E3/uL  Comprehensive metabolic panel  Result Value Ref Range   Glucose 102 (H) 65 - 99 mg/dL   BUN 13 6 - 24 mg/dL   Creatinine, Ser 9.14 0.57 - 1.00 mg/dL   GFR calc non Af Amer 110 >59 mL/min/1.73   GFR calc Af Amer 127 >59 mL/min/1.73   BUN/Creatinine Ratio 23 9 - 23   Sodium 141 134 - 144 mmol/L   Potassium 4.5 3.5 - 5.2 mmol/L   Chloride 106 96 - 106 mmol/L   CO2 21 20 - 29 mmol/L   Calcium 9.5 8.7 - 10.2 mg/dL   Total Protein 6.3 6.0 - 8.5 g/dL   Albumin 4.2 3.5 - 5.5 g/dL   Globulin, Total 2.1 1.5 - 4.5 g/dL   Albumin/Globulin Ratio 2.0 1.2 - 2.2   Bilirubin Total <0.2 0.0 - 1.2 mg/dL   Alkaline Phosphatase 45 39 - 117 IU/L   AST 14 0 - 40 IU/L   ALT 10 0 - 32 IU/L  Lipid Panel w/o Chol/HDL Ratio  Result Value Ref Range   Cholesterol, Total 185 100 - 199 mg/dL   Triglycerides 90 0 - 149 mg/dL   HDL 62 >78 mg/dL   VLDL Cholesterol Cal 18 5 - 40 mg/dL   LDL Calculated 295 (H) 0 -  99 mg/dL  TSH  Result Value Ref Range   TSH 1.010 0.450 - 4.500 uIU/mL  UA/M w/rflx Culture, Routine  Result Value Ref Range   Specific Gravity, UA >1.030 (H) 1.005 - 1.030   pH, UA 5.0 5.0 - 7.5   Color, UA Orange Yellow   Appearance Ur Cloudy (A) Clear   Leukocytes, UA Negative Negative   Protein, UA Negative Negative/Trace   Glucose, UA Negative Negative   Ketones, UA Negative Negative   RBC, UA Negative Negative   Bilirubin, UA Negative Negative   Urobilinogen, Ur 0.2 0.2 - 1.0 mg/dL   Nitrite, UA Negative Negative  IGP, Aptima HPV, rfx 16/18,45  Result  Value Ref Range   DIAGNOSIS: Comment    Specimen adequacy: Comment    Clinician Provided ICD10 Comment    Performed by: Comment    PAP Smear Comment .    Note: Comment    Test Methodology Comment    HPV Aptima Negative Negative      Assessment & Plan:   Problem List Items Addressed This Visit      Cardiovascular and Mediastinum   Migraine    Stable on imitrex prn, continue current regimen      Relevant Medications   gabapentin (NEURONTIN) 300 MG capsule   clonazePAM (KLONOPIN) 0.5 MG tablet     Other   Depression    Some worsening due to marital problems and insomnia. Will add seroquel and continue celexa. Risks and benefits reviewed. Pt not wanting to go to counseling as it's not helped her in the past      Fibromyalgia    Will restart gabapentin while working further on getting lyrica covered for her. Did not have much effect for her in the past, but will try different dosing structures and see if any relief is had. Continue robaxin and naproxen.       Anxiety - Primary    Stable on rare klonopin use and celexa. Continue current regimen. Klonopin scripts are lasting at least 2 months if not longer.        Other Visit Diagnoses    Annual physical exam       Relevant Orders   CBC with Differential/Platelet (Completed)   Comprehensive metabolic panel (Completed)   Lipid Panel w/o Chol/HDL Ratio (Completed)   TSH (Completed)   UA/M w/rflx Culture, Routine (Completed)   Screening for HIV without presence of risk factors       Relevant Orders   HIV antibody (Completed)   Screening for breast cancer       Relevant Orders   MM DIGITAL SCREENING BILATERAL   Screening for cervical cancer       Relevant Orders   IGP, Aptima HPV, rfx 16/18,45 (Completed)       Follow up plan: Return in about 1 month (around 09/14/2017) for Mood f/u.   LABORATORY TESTING:  - Pap smear: pap done - has had abnormal pap smears in the recent past, pap last year was normal but wanting to  repeat this year to make sure  IMMUNIZATIONS:   - Tdap: Tetanus vaccination status reviewed: last tetanus booster within 10 years. - Influenza: Postponed to flu season  SCREENING: -Mammogram: Ordered today  - Colonoscopy: Up to date   PATIENT COUNSELING:   Advised to take 1 mg of folate supplement per day if capable of pregnancy.   Sexuality: Discussed sexually transmitted diseases, partner selection, use of condoms, avoidance of unintended pregnancy  and contraceptive alternatives.  Advised to avoid cigarette smoking.  I discussed with the patient that most people either abstain from alcohol or drink within safe limits (<=14/week and <=4 drinks/occasion for males, <=7/weeks and <= 3 drinks/occasion for females) and that the risk for alcohol disorders and other health effects rises proportionally with the number of drinks per week and how often a drinker exceeds daily limits.  Discussed cessation/primary prevention of drug use and availability of treatment for abuse.   Diet: Encouraged to adjust caloric intake to maintain  or achieve ideal body weight, to reduce intake of dietary saturated fat and total fat, to limit sodium intake by avoiding high sodium foods and not adding table salt, and to maintain adequate dietary potassium and calcium preferably from fresh fruits, vegetables, and low-fat dairy products.    stressed the importance of regular exercise  Injury prevention: Discussed safety belts, safety helmets, smoke detector, smoking near bedding or upholstery.   Dental health: Discussed importance of regular tooth brushing, flossing, and dental visits.    NEXT PREVENTATIVE PHYSICAL DUE IN 1 YEAR. Return in about 1 month (around 09/14/2017) for Mood f/u.

## 2017-08-17 NOTE — Patient Instructions (Signed)
[facility] - [phone]    

## 2017-08-18 LAB — CBC WITH DIFFERENTIAL/PLATELET
BASOS ABS: 0 10*3/uL (ref 0.0–0.2)
Basos: 0 %
EOS (ABSOLUTE): 0.1 10*3/uL (ref 0.0–0.4)
EOS: 0 %
HEMATOCRIT: 39 % (ref 34.0–46.6)
HEMOGLOBIN: 13 g/dL (ref 11.1–15.9)
IMMATURE GRANS (ABS): 0 10*3/uL (ref 0.0–0.1)
Immature Granulocytes: 0 %
Lymphocytes Absolute: 1.9 10*3/uL (ref 0.7–3.1)
Lymphs: 17 %
MCH: 32.5 pg (ref 26.6–33.0)
MCHC: 33.3 g/dL (ref 31.5–35.7)
MCV: 98 fL — ABNORMAL HIGH (ref 79–97)
MONOCYTES: 4 %
Monocytes Absolute: 0.4 10*3/uL (ref 0.1–0.9)
NEUTROS PCT: 79 %
Neutrophils Absolute: 8.9 10*3/uL — ABNORMAL HIGH (ref 1.4–7.0)
Platelets: 339 10*3/uL (ref 150–379)
RBC: 4 x10E6/uL (ref 3.77–5.28)
RDW: 13.3 % (ref 12.3–15.4)
WBC: 11.3 10*3/uL — AB (ref 3.4–10.8)

## 2017-08-18 LAB — COMPREHENSIVE METABOLIC PANEL
ALBUMIN: 4.2 g/dL (ref 3.5–5.5)
ALT: 10 IU/L (ref 0–32)
AST: 14 IU/L (ref 0–40)
Albumin/Globulin Ratio: 2 (ref 1.2–2.2)
Alkaline Phosphatase: 45 IU/L (ref 39–117)
BUN / CREAT RATIO: 23 (ref 9–23)
BUN: 13 mg/dL (ref 6–24)
Bilirubin Total: <0.2 mg/dL (ref 0.0–1.2)
CO2: 21 mmol/L (ref 20–29)
CREATININE: 0.57 mg/dL (ref 0.57–1.00)
Calcium: 9.5 mg/dL (ref 8.7–10.2)
Chloride: 106 mmol/L (ref 96–106)
GFR calc Af Amer: 127 mL/min/{1.73_m2} (ref >59)
GFR, EST NON AFRICAN AMERICAN: 110 mL/min/{1.73_m2} (ref >59)
GLOBULIN, TOTAL: 2.1 g/dL (ref 1.5–4.5)
GLUCOSE: 102 mg/dL — AB (ref 65–99)
Potassium: 4.5 mmol/L (ref 3.5–5.2)
SODIUM: 141 mmol/L (ref 134–144)
Total Protein: 6.3 g/dL (ref 6.0–8.5)

## 2017-08-18 LAB — TSH: TSH: 1.01 u[IU]/mL (ref 0.450–4.500)

## 2017-08-18 LAB — LIPID PANEL W/O CHOL/HDL RATIO
CHOLESTEROL TOTAL: 185 mg/dL (ref 100–199)
HDL: 62 mg/dL (ref >39)
LDL Calculated: 105 mg/dL — ABNORMAL HIGH (ref 0–99)
TRIGLYCERIDES: 90 mg/dL (ref 0–149)
VLDL CHOLESTEROL CAL: 18 mg/dL (ref 5–40)

## 2017-08-18 LAB — HIV ANTIBODY (ROUTINE TESTING W REFLEX): HIV SCREEN 4TH GENERATION: NONREACTIVE

## 2017-08-19 LAB — IGP, APTIMA HPV, RFX 16/18,45
HPV APTIMA: NEGATIVE
PAP Smear Comment: 0

## 2017-08-20 NOTE — Assessment & Plan Note (Addendum)
Stable on rare klonopin use and celexa. Continue current regimen. Klonopin scripts are lasting at least 2 months if not longer.

## 2017-08-20 NOTE — Assessment & Plan Note (Signed)
Stable on imitrex prn, continue current regimen

## 2017-08-20 NOTE — Assessment & Plan Note (Signed)
Will restart gabapentin while working further on getting lyrica covered for her. Did not have much effect for her in the past, but will try different dosing structures and see if any relief is had. Continue robaxin and naproxen.

## 2017-08-20 NOTE — Assessment & Plan Note (Signed)
Some worsening due to marital problems and insomnia. Will add seroquel and continue celexa. Risks and benefits reviewed. Pt not wanting to go to counseling as it's not helped her in the past

## 2017-08-22 ENCOUNTER — Telehealth: Payer: Self-pay | Admitting: Family Medicine

## 2017-08-22 ENCOUNTER — Other Ambulatory Visit: Payer: Self-pay | Admitting: Family Medicine

## 2017-08-22 DIAGNOSIS — N6310 Unspecified lump in the right breast, unspecified quadrant: Secondary | ICD-10-CM

## 2017-08-22 NOTE — Telephone Encounter (Signed)
Here for documentation.

## 2017-08-22 NOTE — Telephone Encounter (Signed)
-----   Message from Marshall Cork, [place] sent at 08/21/2017  1:38 PM EDT ----- Regarding: RE: Lyrica Prior Auth What keeps happening is her address doesn't match her address on her insurance card. I notified her to change this. Went on [employer] and go correct address again and submitted a new PA. Waiting for result.    ----- Message ----- From: Particia Nearing, PA-C Sent: 08/17/2017   9:28 AM To: Marshall Cork, CMA Subject: Lyrica Prior Auth                              Pt states she still has been unable to get her Lyrica - can you look into this when you get back? I thought we had decided it was a name discrepancy or something but she said she fixed that and it still didn't go through

## 2017-09-04 ENCOUNTER — Other Ambulatory Visit: Payer: Self-pay

## 2017-09-19 ENCOUNTER — Ambulatory Visit: Payer: 59 | Admitting: Family Medicine

## 2017-09-28 ENCOUNTER — Other Ambulatory Visit: Payer: Self-pay | Admitting: Family Medicine

## 2017-09-28 MED ORDER — QUETIAPINE FUMARATE 50 MG PO TABS
50.0000 mg | ORAL_TABLET | Freq: Every day | ORAL | 0 refills | Status: DC
Start: 2017-09-28 — End: 2017-10-12

## 2017-09-28 MED ORDER — CLONAZEPAM 0.5 MG PO TABS: 0.5000 mg | ORAL_TABLET | Freq: Every day | ORAL | 0 refills | Status: DC | PRN

## 2017-09-28 MED ORDER — SUMATRIPTAN SUCCINATE 50 MG PO TABS: 50.0000 mg | ORAL_TABLET | ORAL | 0 refills | Status: DC | PRN

## 2017-10-12 ENCOUNTER — Other Ambulatory Visit: Payer: Self-pay | Admitting: Family Medicine

## 2017-10-12 ENCOUNTER — Encounter: Payer: Self-pay | Admitting: Family Medicine

## 2017-10-12 MED ORDER — SUMATRIPTAN SUCCINATE 50 MG PO TABS: 50.0000 mg | ORAL_TABLET | ORAL | 0 refills | Status: DC | PRN

## 2017-10-12 MED ORDER — QUETIAPINE FUMARATE 50 MG PO TABS: 50.0000 mg | ORAL_TABLET | Freq: Every day | ORAL | 0 refills | Status: DC

## 2017-10-23 ENCOUNTER — Ambulatory Visit: Payer: 59 | Admitting: Family Medicine

## 2017-10-24 ENCOUNTER — Ambulatory Visit: Payer: 59 | Admitting: Family Medicine

## 2017-10-27 ENCOUNTER — Other Ambulatory Visit: Payer: Self-pay | Admitting: Family Medicine

## 2017-10-27 NOTE — Telephone Encounter (Signed)
Clonazepam refill Last Refill:09/28/17 #30 Last OV: 08/17/17 PCP: Dr. Dossie Arbourrissman

## 2017-10-27 NOTE — Telephone Encounter (Signed)
Interface request for refill for Gabapentin                                                Robaxin                                                 Atarax   Last refill Gabapentin:  09/25/17  last refill Robaxin :  09/25/17 Last refill:  Atarax 3/ 1/19   LOV 5/219 with Roosvelt Maserachel Lane PA   Pharmacy:  [facility]

## 2017-12-04 ENCOUNTER — Other Ambulatory Visit: Payer: Self-pay

## 2017-12-04 NOTE — Telephone Encounter (Signed)
Request for Clonazepam 0.5mg  tablet.  Last Fill: 10/27/2017  Last Visit: 08/17/2017  Canceled 09/19/17; 10/23/17 No-showed 10/23/2017  No upcoming appointments.

## 2017-12-08 ENCOUNTER — Other Ambulatory Visit: Payer: Self-pay | Admitting: Family Medicine

## 2017-12-08 NOTE — Telephone Encounter (Signed)
gabapentin refill Last Refill:10/27/17 # 60 Last OV: 08/17/17 PCP: Roosvelt Maserachel Lane Pharmacy: [facility] [place], [place]  methocarbamol refill Last Refill:10/27/17 # 90 Last OV: 08/17/17 PCP: Roosvelt Maserachel Lane [facility] [place], [place]

## 2017-12-08 NOTE — Telephone Encounter (Signed)
Refills sent. Needs appt for further refills.

## 2017-12-14 ENCOUNTER — Other Ambulatory Visit: Payer: Self-pay | Admitting: Family Medicine

## 2017-12-14 MED ORDER — SUMATRIPTAN SUCCINATE 50 MG PO TABS: 50.0000 mg | ORAL_TABLET | ORAL | 0 refills | Status: DC | PRN

## 2017-12-29 ENCOUNTER — Ambulatory Visit
Admission: RE | Admit: 2017-12-29 | Discharge: 2017-12-29 | Disposition: A | Discharge status: Home / Self Care | Payer: Self-pay | Source: Ambulatory Visit | Attending: Family Medicine | Admitting: Family Medicine

## 2017-12-29 DIAGNOSIS — N6310 Unspecified lump in the right breast, unspecified quadrant: Secondary | ICD-10-CM

## 2018-01-08 ENCOUNTER — Other Ambulatory Visit: Payer: Self-pay | Admitting: *Deleted

## 2018-01-08 ENCOUNTER — Inpatient Hospital Stay
Admission: RE | Admit: 2018-01-08 | Discharge: 2018-01-08 | Disposition: A | Discharge status: Home / Self Care | Payer: Self-pay | Source: Ambulatory Visit | Attending: *Deleted | Admitting: *Deleted

## 2018-01-08 DIAGNOSIS — Z9289 Personal history of other medical treatment: Secondary | ICD-10-CM

## 2018-01-10 ENCOUNTER — Encounter: Payer: Self-pay | Admitting: Family Medicine

## 2018-01-19 ENCOUNTER — Other Ambulatory Visit: Payer: Self-pay | Admitting: Family Medicine

## 2018-03-03 ENCOUNTER — Other Ambulatory Visit: Payer: Self-pay | Admitting: Family Medicine

## 2018-03-05 NOTE — Telephone Encounter (Signed)
Patient called and advised an appointment is needed in order to provide refills, she verbalized understanding, appointment scheduled for Wednesday, 03/14/18 at 0945. Patient asked is she will be able to receive a refill today or wait until her appointment, she says she has been out of Celexa. I advised she will receive enough Celexa until her appointment, she verbalized understanding.

## 2018-03-05 NOTE — Telephone Encounter (Signed)
Courtesy refill until appointment 03/14/18.

## 2018-03-14 ENCOUNTER — Encounter: Payer: Self-pay | Admitting: Family Medicine

## 2018-03-14 ENCOUNTER — Ambulatory Visit (INDEPENDENT_AMBULATORY_CARE_PROVIDER_SITE_OTHER): Payer: Self-pay | Admitting: Family Medicine

## 2018-03-14 VITALS — BP 131/87 | HR 72 | Temp 98.5°F | Ht 59.0 in | Wt 135.2 lb

## 2018-03-14 DIAGNOSIS — F419 Anxiety disorder, unspecified: Secondary | ICD-10-CM

## 2018-03-14 DIAGNOSIS — N920 Excessive and frequent menstruation with regular cycle: Secondary | ICD-10-CM

## 2018-03-14 DIAGNOSIS — M797 Fibromyalgia: Secondary | ICD-10-CM

## 2018-03-14 DIAGNOSIS — F330 Major depressive disorder, recurrent, mild: Secondary | ICD-10-CM

## 2018-03-14 MED ORDER — GABAPENTIN 300 MG PO CAPS: 300.0000 mg | ORAL_CAPSULE | Freq: Three times a day (TID) | ORAL | 1 refills | Status: DC

## 2018-03-14 MED ORDER — QUETIAPINE FUMARATE 100 MG PO TABS: 50.0000 mg | ORAL_TABLET | Freq: Every day | ORAL | 1 refills | Status: DC

## 2018-03-14 MED ORDER — CITALOPRAM HYDROBROMIDE 40 MG PO TABS: 40.0000 mg | ORAL_TABLET | Freq: Every day | ORAL | 1 refills | Status: DC

## 2018-03-14 MED ORDER — MEDROXYPROGESTERONE ACETATE 5 MG PO TABS: 5.0000 mg | ORAL_TABLET | Freq: Every day | ORAL | 0 refills | Status: DC

## 2018-03-14 NOTE — Progress Notes (Addendum)
BP 131/87 (BP Location: Left Arm, Patient Position: Sitting, Cuff Size: Normal)   Pulse 72   Temp 98.5 F (36.9 C)   Ht 4\' 11"  (1.499 m)   Wt 135 lb 4 oz (61.3 kg)   SpO2 98%   BMI 27.32 kg/m    Subjective:    [name], female    DOB: June 24, 1970, 47 y.o.   MRN: [id number]  HPI: Heather Costa is a 47 y.o. female  Chief Complaint  Patient presents with  . Depression   States her life has taken a "nose-dive" since last visit. Going through a divorce and lost health insurance. Has been off all her medications other than celexa the past few months because of it. Still taking 50 mg seroquel which does seem to help. Cannot control her moods without it. Was previously on celexa and prn klonopin for severe anxiety episodes.   Menorrhagia for years, patient has a friend taking provera to help with these sxs and would like to do this. Has not tolerated combination contraceptives in the past due to mood issues but has tolerated depo injections in the past. Not wanting injections or implantable devices.   Fibromyalgia under poor control without lyrica and with significantly increased stress, but gabapentin is helping some.    Depression screen [facility] 2/9 03/14/2018 08/17/2017  Decreased Interest 3 0  Down, Depressed, Hopeless 3 1  PHQ - 2 Score 6 1  Altered sleeping 3 1  Tired, decreased energy 3 1  Change in appetite 3 1  Feeling bad or failure about yourself  2 0  Trouble concentrating 2 0  Moving slowly or fidgety/restless 2 1  Suicidal thoughts 0 0  PHQ-9 Score 21 5  Difficult doing work/chores Somewhat difficult -    Relevant past medical, surgical, family and social history reviewed and updated as indicated. Interim medical history since our last visit reviewed. Allergies and medications reviewed and updated.  Review of Systems  Per HPI unless specifically indicated above     Objective:    BP 131/87 (BP Location: Left Arm, Patient Position: Sitting,  Cuff Size: Normal)   Pulse 72   Temp 98.5 F (36.9 C)   Ht 4\' 11"  (1.499 m)   Wt 135 lb 4 oz (61.3 kg)   SpO2 98%   BMI 27.32 kg/m   Wt Readings from Last 3 Encounters:  03/14/18 135 lb 4 oz (61.3 kg)  08/17/17 141 lb 6.4 oz (64.1 kg)  06/16/17 145 lb 1.6 oz (65.8 kg)    Physical Exam Vitals signs and nursing note reviewed.  Constitutional:      Appearance: Normal appearance. She is not ill-appearing.  HENT:     Head: Atraumatic.  Eyes:     Extraocular Movements: Extraocular movements intact.     Conjunctiva/sclera: Conjunctivae normal.  Neck:     Musculoskeletal: Normal range of motion and neck supple.  Cardiovascular:     Rate and Rhythm: Normal rate and regular rhythm.     Heart sounds: Normal heart sounds.  Pulmonary:     Effort: Pulmonary effort is normal.     Breath sounds: Normal breath sounds.  Abdominal:     General: Bowel sounds are normal.     Palpations: Abdomen is soft.     Tenderness: There is no abdominal tenderness. There is no guarding.  Genitourinary:    Comments: Declines pelvic exam Musculoskeletal: Normal range of motion.  Skin:    General: Skin is warm and dry.  Neurological:     Mental Status: She is alert and oriented to person, place, and time.  Psychiatric:        Thought Content: Thought content normal.        Judgment: Judgment normal.     Comments: Anxious, tearful     Results for orders placed or performed in visit on 08/17/17  HIV antibody  Result Value Ref Range   HIV Screen 4th Generation wRfx Non Reactive Non Reactive  CBC with Differential/Platelet  Result Value Ref Range   WBC 11.3 (H) 3.4 - 10.8 x10E3/uL   RBC 4.00 3.77 - 5.28 x10E6/uL   Hemoglobin 13.0 11.1 - 15.9 g/dL   Hematocrit 16.139.0 09.634.0 - 46.6 %   MCV 98 (H) 79 - 97 fL   MCH 32.5 26.6 - 33.0 pg   MCHC 33.3 31.5 - 35.7 g/dL   RDW 04.513.3 40.912.3 - 81.115.4 %   Platelets 339 150 - 379 x10E3/uL   Neutrophils 79 Not Estab. %   Lymphs 17 Not Estab. %   Monocytes 4 Not  Estab. %   Eos 0 Not Estab. %   Basos 0 Not Estab. %   Neutrophils Absolute 8.9 (H) 1.4 - 7.0 x10E3/uL   Lymphocytes Absolute 1.9 0.7 - 3.1 x10E3/uL   Monocytes Absolute 0.4 0.1 - 0.9 x10E3/uL   EOS (ABSOLUTE) 0.1 0.0 - 0.4 x10E3/uL   Basophils Absolute 0.0 0.0 - 0.2 x10E3/uL   Immature Granulocytes 0 Not Estab. %   Immature Grans (Abs) 0.0 0.0 - 0.1 x10E3/uL  Comprehensive metabolic panel  Result Value Ref Range   Glucose 102 (H) 65 - 99 mg/dL   BUN 13 6 - 24 mg/dL   Creatinine, Ser 9.140.57 0.57 - 1.00 mg/dL   GFR calc non Af Amer 110 >59 mL/min/1.73   GFR calc Af Amer 127 >59 mL/min/1.73   BUN/Creatinine Ratio 23 9 - 23   Sodium 141 134 - 144 mmol/L   Potassium 4.5 3.5 - 5.2 mmol/L   Chloride 106 96 - 106 mmol/L   CO2 21 20 - 29 mmol/L   Calcium 9.5 8.7 - 10.2 mg/dL   Total Protein 6.3 6.0 - 8.5 g/dL   Albumin 4.2 3.5 - 5.5 g/dL   Globulin, Total 2.1 1.5 - 4.5 g/dL   Albumin/Globulin Ratio 2.0 1.2 - 2.2   Bilirubin Total <0.2 0.0 - 1.2 mg/dL   Alkaline Phosphatase 45 39 - 117 IU/L   AST 14 0 - 40 IU/L   ALT 10 0 - 32 IU/L  Lipid Panel w/o Chol/HDL Ratio  Result Value Ref Range   Cholesterol, Total 185 100 - 199 mg/dL   Triglycerides 90 0 - 149 mg/dL   HDL 62 >78>39 mg/dL   VLDL Cholesterol Cal 18 5 - 40 mg/dL   LDL Calculated 295105 (H) 0 - 99 mg/dL  TSH  Result Value Ref Range   TSH 1.010 0.450 - 4.500 uIU/mL  UA/M w/rflx Culture, Routine  Result Value Ref Range   Specific Gravity, UA >1.030 (H) 1.005 - 1.030   pH, UA 5.0 5.0 - 7.5   Color, UA Orange Yellow   Appearance Ur Cloudy (A) Clear   Leukocytes, UA Negative Negative   Protein, UA Negative Negative/Trace   Glucose, UA Negative Negative   Ketones, UA Negative Negative   RBC, UA Negative Negative   Bilirubin, UA Negative Negative   Urobilinogen, Ur 0.2 0.2 - 1.0 mg/dL   Nitrite, UA Negative Negative  IGP, Aptima HPV, rfx  16/18,45  Result Value Ref Range   DIAGNOSIS: Comment    Specimen adequacy: Comment     Clinician Provided ICD10 Comment    Performed by: Comment    PAP Smear Comment .    Note: Comment    Test Methodology Comment    HPV Aptima Negative Negative      Assessment & Plan:   Problem List Items Addressed This Visit      Other   Depression    Restart celexa, continue seroquel      Relevant Medications   citalopram (CELEXA) 40 MG tablet   Fibromyalgia    Unable to afford lyrica without insurance. Start robaxin prn, continue gabapentin      Anxiety - Primary    Continue seroquel, restart celexa. Cannot afford counseling as she lost her insurance. Will re-evaluate this once she regains insurance      Relevant Medications   citalopram (CELEXA) 40 MG tablet    Other Visit Diagnoses    Menorrhagia with regular cycle       Trial provera per pt request, follow up with GYN if not improved for further workup       Follow up plan: Return in about 3 months (around 06/14/2018) for CPE.

## 2018-03-19 DIAGNOSIS — K219 Gastro-esophageal reflux disease without esophagitis: Secondary | ICD-10-CM | POA: Insufficient documentation

## 2018-04-19 ENCOUNTER — Encounter: Payer: Self-pay | Admitting: Family Medicine

## 2018-04-20 ENCOUNTER — Other Ambulatory Visit: Payer: Self-pay | Admitting: Family Medicine

## 2018-04-20 DIAGNOSIS — N921 Excessive and frequent menstruation with irregular cycle: Secondary | ICD-10-CM

## 2018-04-27 ENCOUNTER — Telehealth: Payer: Self-pay | Admitting: Obstetrics & Gynecology

## 2018-04-27 NOTE — Telephone Encounter (Signed)
[facility] referring for Menorrhagia with irregular cycle. Unable to reach patient, Phone number on file is currently unable to receive calls at this time.

## 2018-04-29 NOTE — Assessment & Plan Note (Signed)
Restart celexa, continue seroquel

## 2018-04-29 NOTE — Assessment & Plan Note (Signed)
Continue seroquel, restart celexa. Cannot afford counseling as she lost her insurance. Will re-evaluate this once she regains insurance

## 2018-04-29 NOTE — Assessment & Plan Note (Signed)
Unable to afford lyrica without insurance. Start robaxin prn, continue gabapentin

## 2018-05-09 NOTE — Telephone Encounter (Signed)
Called and left voice mail for patient to call back to be schedule °

## 2018-05-21 ENCOUNTER — Encounter: Payer: Self-pay | Admitting: Obstetrics and Gynecology

## 2018-05-25 ENCOUNTER — Other Ambulatory Visit: Payer: Self-pay | Admitting: Family Medicine

## 2018-05-25 MED ORDER — SUMATRIPTAN SUCCINATE 50 MG PO TABS: 50.0000 mg | ORAL_TABLET | ORAL | 0 refills | Status: DC | PRN

## 2018-05-25 MED ORDER — HYDROXYZINE HCL 25 MG PO TABS: 25.0000 mg | ORAL_TABLET | Freq: Three times a day (TID) | ORAL | 0 refills | Status: DC | PRN

## 2018-05-25 MED ORDER — METHOCARBAMOL 500 MG PO TABS: 500.0000 mg | ORAL_TABLET | Freq: Three times a day (TID) | ORAL | 0 refills | Status: DC

## 2018-06-18 ENCOUNTER — Encounter: Payer: Self-pay | Admitting: Obstetrics and Gynecology

## 2018-07-18 ENCOUNTER — Other Ambulatory Visit: Payer: Self-pay | Admitting: Family Medicine

## 2018-07-18 NOTE — Telephone Encounter (Signed)
Requested Prescriptions  Pending Prescriptions Disp Refills  . SUMAtriptan (IMITREX) 50 MG tablet [Pharmacy Med Name: SUMAtriptan Succinate 50 MG Oral Tablet] 10 tablet 0    Sig: TAKE 1 TABLET BY MOUTH EVERY 2 HOURS AS NEEDED FOR MIGRAINE. MAY REPEAT IN 2 HOURS IF HEADACHE PERSISTS OR RECURS.     Neurology:  Migraine Therapy - Triptan Passed - 07/18/2018  1:07 PM      Passed - Last BP in normal range    BP Readings from Last 1 Encounters:  03/14/18 131/87         Passed - Valid encounter within last 12 months    Recent Outpatient Visits          4 months ago Anxiety   [facility] Roosvelt Maser [place], [place]   11 months ago Anxiety   [facility] Roosvelt Maser [place], [place]   1 year ago Fibromyalgia   [facility] Roosvelt Maser [place], [place]   1 year ago Anxiety   [facility] Roosvelt Maser [place], [place]   1 year ago Moderate episode of recurrent major depressive disorder [facility])   [facility] Particia Nearing, [place]

## 2018-08-14 ENCOUNTER — Encounter: Payer: Self-pay | Admitting: Family Medicine

## 2018-10-16 ENCOUNTER — Other Ambulatory Visit: Payer: Self-pay | Admitting: Family Medicine

## 2018-10-16 NOTE — Telephone Encounter (Signed)
Please advise 

## 2019-01-26 ENCOUNTER — Other Ambulatory Visit: Payer: Self-pay | Admitting: Family Medicine

## 2019-08-26 ENCOUNTER — Ambulatory Visit: Payer: 59 | Admitting: Family Medicine

## 2019-08-26 ENCOUNTER — Other Ambulatory Visit: Payer: Self-pay

## 2019-08-26 ENCOUNTER — Encounter: Payer: Self-pay | Admitting: Family Medicine

## 2019-08-26 VITALS — BP 118/80 | HR 73 | Temp 98.1°F | Ht 59.0 in | Wt 125.5 lb

## 2019-08-26 DIAGNOSIS — F330 Major depressive disorder, recurrent, mild: Secondary | ICD-10-CM

## 2019-08-26 DIAGNOSIS — F419 Anxiety disorder, unspecified: Secondary | ICD-10-CM | POA: Diagnosis not present

## 2019-08-26 DIAGNOSIS — M797 Fibromyalgia: Secondary | ICD-10-CM

## 2019-08-26 DIAGNOSIS — Z1211 Encounter for screening for malignant neoplasm of colon: Secondary | ICD-10-CM

## 2019-08-26 MED ORDER — METHOCARBAMOL 500 MG PO TABS: 500.0000 mg | ORAL_TABLET | Freq: Three times a day (TID) | ORAL | 0 refills | Status: DC

## 2019-08-26 MED ORDER — PREGABALIN 75 MG PO CAPS: 75.0000 mg | ORAL_CAPSULE | Freq: Two times a day (BID) | ORAL | 1 refills | Status: DC

## 2019-08-26 MED ORDER — BUSPIRONE HCL 5 MG PO TABS: 5.0000 mg | ORAL_TABLET | Freq: Three times a day (TID) | ORAL | 0 refills | Status: DC | PRN

## 2019-08-26 MED ORDER — OMEPRAZOLE 40 MG PO CPDR: 40.0000 mg | DELAYED_RELEASE_CAPSULE | Freq: Every day | ORAL | 1 refills | Status: DC

## 2019-08-26 MED ORDER — GABAPENTIN 300 MG PO CAPS: 300.0000 mg | ORAL_CAPSULE | Freq: Three times a day (TID) | ORAL | 0 refills | Status: DC

## 2019-08-26 NOTE — Progress Notes (Signed)
BP 118/80   Pulse 73   Temp 98.1 F (36.7 C)   Ht 4\' 11"  (1.499 m) Comment: Patient informed  Wt 125 lb 8 oz (56.9 kg)   LMP 07/18/2019 (Approximate)   SpO2 98%   BMI 25.35 kg/m    Subjective:    [name], female    DOB: 1971-01-15, 49 y.o.   MRN: [id number]  HPI: Heather Costa is a 49 y.o. female  Chief Complaint  Patient presents with  . migraines  . Anxiety  . Fibromyalgia  . Other    Patient is past due for a 6 month follow up mammogram   Off all her medications currently due to lack of insurance.   Depression - feeling she's in a really good place right now as far as moods, still dealing with anxiety issues. Several days out of the week having a panic attack. Worse during PMS time frame.   Now dealing with hot flashes and night sweats, wakes up several times nightly soaked in sweat.   Fibromyalgia - was never able to get lyrica covered due to insurance previously, has new insurance now. Previously was on gabapentin which worked well for her prn. Some days does well, but many days after long work days in severe pain with muscle aches. Keeps robaxin around prn, currently out.   Has lost 15 lb in the past year and a half working out more and eating better. Feeling great overall.   Depression screen [facility] 2/9 08/26/2019 03/14/2018 08/17/2017  Decreased Interest 0 3 0  Down, Depressed, Hopeless 0 3 1  PHQ - 2 Score 0 6 1  Altered sleeping 3 3 1   Tired, decreased energy 3 3 1   Change in appetite 0 3 1  Feeling bad or failure about yourself  0 2 0  Trouble concentrating 0 2 0  Moving slowly or fidgety/restless 0 2 1  Suicidal thoughts 0 0 0  PHQ-9 Score 6 21 5   Difficult doing work/chores Not difficult at all Somewhat difficult -   GAD 7 : Generalized Anxiety Score 08/26/2019 03/14/2018 05/19/2017  Nervous, Anxious, on Edge 1 3 3   Control/stop worrying 3 3 3   Worry too much - different things 3 3 3   Trouble relaxing 3 3 3   Restless 3 3 3   Easily  annoyed or irritable 3 3 3   Afraid - awful might happen 3 2 2   Total GAD 7 Score 19 20 20   Anxiety Difficulty Somewhat difficult Very difficult -     Relevant past medical, surgical, family and social history reviewed and updated as indicated. Interim medical history since our last visit reviewed. Allergies and medications reviewed and updated.  Review of Systems  Per HPI unless specifically indicated above     Objective:    BP 118/80   Pulse 73   Temp 98.1 F (36.7 C)   Ht 4\' 11"  (1.499 m) Comment: Patient informed  Wt 125 lb 8 oz (56.9 kg)   LMP 07/18/2019 (Approximate)   SpO2 98%   BMI 25.35 kg/m   Wt Readings from Last 3 Encounters:  08/26/19 125 lb 8 oz (56.9 kg)  03/14/18 135 lb 4 oz (61.3 kg)  08/17/17 141 lb 6.4 oz (64.1 kg)    Physical Exam Vitals and nursing note reviewed.  Constitutional:      Appearance: Normal appearance. She is not ill-appearing.  HENT:     Head: Atraumatic.  Eyes:     Extraocular Movements: Extraocular movements intact.  Conjunctiva/sclera: Conjunctivae normal.  Cardiovascular:     Rate and Rhythm: Normal rate and regular rhythm.     Heart sounds: Normal heart sounds.  Pulmonary:     Effort: Pulmonary effort is normal.     Breath sounds: Normal breath sounds.  Musculoskeletal:        General: Normal range of motion.     Cervical back: Normal range of motion and neck supple.  Skin:    General: Skin is warm and dry.  Neurological:     Mental Status: She is alert and oriented to person, place, and time.  Psychiatric:        Mood and Affect: Mood normal.        Thought Content: Thought content normal.        Judgment: Judgment normal.     Results for orders placed or performed in visit on 08/17/17  HIV antibody  Result Value Ref Range   HIV Screen 4th Generation wRfx Non Reactive Non Reactive  CBC with Differential/Platelet  Result Value Ref Range   WBC 11.3 (H) 3.4 - 10.8 x10E3/uL   RBC 4.00 3.77 - 5.28 x10E6/uL    Hemoglobin 13.0 11.1 - 15.9 g/dL   Hematocrit 41.9 62.2 - 46.6 %   MCV 98 (H) 79 - 97 fL   MCH 32.5 26.6 - 33.0 pg   MCHC 33.3 31.5 - 35.7 g/dL   RDW 29.7 98.9 - 21.1 %   Platelets 339 150 - 379 x10E3/uL   Neutrophils 79 Not Estab. %   Lymphs 17 Not Estab. %   Monocytes 4 Not Estab. %   Eos 0 Not Estab. %   Basos 0 Not Estab. %   Neutrophils Absolute 8.9 (H) 1.4 - 7.0 x10E3/uL   Lymphocytes Absolute 1.9 0.7 - 3.1 x10E3/uL   Monocytes Absolute 0.4 0.1 - 0.9 x10E3/uL   EOS (ABSOLUTE) 0.1 0.0 - 0.4 x10E3/uL   Basophils Absolute 0.0 0.0 - 0.2 x10E3/uL   Immature Granulocytes 0 Not Estab. %   Immature Grans (Abs) 0.0 0.0 - 0.1 x10E3/uL  Comprehensive metabolic panel  Result Value Ref Range   Glucose 102 (H) 65 - 99 mg/dL   BUN 13 6 - 24 mg/dL   Creatinine, Ser 9.41 0.57 - 1.00 mg/dL   GFR calc non Af Amer 110 >59 mL/min/1.73   GFR calc Af Amer 127 >59 mL/min/1.73   BUN/Creatinine Ratio 23 9 - 23   Sodium 141 134 - 144 mmol/L   Potassium 4.5 3.5 - 5.2 mmol/L   Chloride 106 96 - 106 mmol/L   CO2 21 20 - 29 mmol/L   Calcium 9.5 8.7 - 10.2 mg/dL   Total Protein 6.3 6.0 - 8.5 g/dL   Albumin 4.2 3.5 - 5.5 g/dL   Globulin, Total 2.1 1.5 - 4.5 g/dL   Albumin/Globulin Ratio 2.0 1.2 - 2.2   Bilirubin Total <0.2 0.0 - 1.2 mg/dL   Alkaline Phosphatase 45 39 - 117 IU/L   AST 14 0 - 40 IU/L   ALT 10 0 - 32 IU/L  Lipid Panel w/o Chol/HDL Ratio  Result Value Ref Range   Cholesterol, Total 185 100 - 199 mg/dL   Triglycerides 90 0 - 149 mg/dL   HDL 62 >74 mg/dL   VLDL Cholesterol Cal 18 5 - 40 mg/dL   LDL Calculated 081 (H) 0 - 99 mg/dL  TSH  Result Value Ref Range   TSH 1.010 0.450 - 4.500 uIU/mL  UA/M w/rflx Culture, Routine  Specimen: Urine   URINE  Result Value Ref Range   Specific Gravity, UA >1.030 (H) 1.005 - 1.030   pH, UA 5.0 5.0 - 7.5   Color, UA Orange Yellow   Appearance Ur Cloudy (A) Clear   Leukocytes, UA Negative Negative   Protein, UA Negative Negative/Trace    Glucose, UA Negative Negative   Ketones, UA Negative Negative   RBC, UA Negative Negative   Bilirubin, UA Negative Negative   Urobilinogen, Ur 0.2 0.2 - 1.0 mg/dL   Nitrite, UA Negative Negative  IGP, Aptima HPV, rfx 16/18,45  Result Value Ref Range   DIAGNOSIS: Comment    Specimen adequacy: Comment    Clinician Provided ICD10 Comment    Performed by: Comment    PAP Smear Comment .    Note: Comment    Test Methodology Comment    HPV Aptima Negative Negative      Assessment & Plan:   Problem List Items Addressed This Visit      Other   Depression    Moods overall doing well, mostly struggling with PMS cycles. Buspar prn for these times      Relevant Medications   busPIRone (BUSPAR) 5 MG tablet   Fibromyalgia    Will try re-sending lyrica, small supply of gabapentin sent in case not covered. Refilled robaxin for prn use      Relevant Medications   pregabalin (LYRICA) 75 MG capsule   gabapentin (NEURONTIN) 300 MG capsule   methocarbamol (ROBAXIN) 500 MG tablet   Anxiety    Not well controlled, add buspar for prn use and monitor for benefit      Relevant Medications   busPIRone (BUSPAR) 5 MG tablet    Other Visit Diagnoses    Screening for colon cancer    -  Primary   Relevant Orders   Ambulatory referral to Gastroenterology       Follow up plan: Return for as scheduled.

## 2019-08-28 ENCOUNTER — Telehealth (INDEPENDENT_AMBULATORY_CARE_PROVIDER_SITE_OTHER): Payer: Self-pay | Admitting: Gastroenterology

## 2019-08-28 DIAGNOSIS — Z1211 Encounter for screening for malignant neoplasm of colon: Secondary | ICD-10-CM

## 2019-08-28 NOTE — Progress Notes (Signed)
Gastroenterology Pre-Procedure Review  Request Date: Monday 09/30/19 Requesting Physician: Dr. Servando Snare  PATIENT REVIEW QUESTIONS: The patient responded to the following health history questions as indicated:    1. Are you having any GI issues? no 2. Do you have a personal history of Polyps? no 3. Do you have a family history of Colon Cancer or Polyps? yes (Paternal grandmother, and father colon cancer) 4. Diabetes Mellitus? no 5. Joint replacements in the past 12 months?no 6. Major health problems in the past 3 months?no 7. Any artificial heart valves, MVP, or defibrillator?no    MEDICATIONS & ALLERGIES:    Patient reports the following regarding taking any anticoagulation/antiplatelet therapy:   Plavix, Coumadin, Eliquis, Xarelto, Lovenox, Pradaxa, Brilinta, or Effient? no Aspirin? no  Patient confirms/reports the following medications:  Current Outpatient Medications  Medication Sig Dispense Refill  . busPIRone (BUSPAR) 5 MG tablet Take 1 tablet (5 mg total) by mouth 3 (three) times daily as needed. 90 tablet 0  . gabapentin (NEURONTIN) 300 MG capsule Take 1 capsule (300 mg total) by mouth 3 (three) times daily. 90 capsule 0  . omeprazole (PRILOSEC) 40 MG capsule Take 1 capsule (40 mg total) by mouth daily. 90 capsule 1  . SUMAtriptan (IMITREX) 50 MG tablet TAKE 1 TABLET BY MOUTH EVERY 2 HOURS AS NEEDED FOR MIGRAINE. MAY REPEAT IN 2 HOURS IF HEADACHE PERSISTS OR RECURS. 10 tablet 0  . clonazePAM (KLONOPIN) 0.5 MG tablet Take 1 tablet (0.5 mg total) by mouth daily as needed for anxiety. (Patient not taking: Reported on 03/14/2018) 30 tablet 0  . hydrOXYzine (ATARAX/VISTARIL) 25 MG tablet Take 1 tablet by mouth three times daily as needed (Patient not taking: Reported on 08/26/2019) 90 tablet 0  . methocarbamol (ROBAXIN) 500 MG tablet Take 1 tablet (500 mg total) by mouth 3 (three) times daily. 90 tablet 0  . pregabalin (LYRICA) 75 MG capsule Take 1 capsule (75 mg total) by mouth 2 (two)  times daily. (Patient not taking: Reported on 08/28/2019) 60 capsule 1   No current facility-administered medications for this visit.    Patient confirms/reports the following allergies:  Allergies  Allergen Reactions  . Sulfa Antibiotics Anaphylaxis    No orders of the defined types were placed in this encounter.   AUTHORIZATION INFORMATION Primary Insurance: 1D#: Group #:  Secondary Insurance: 1D#: Group #:  SCHEDULE INFORMATION: Date: Monday 09/30/19 Time: Location:[facility]

## 2019-09-01 NOTE — Assessment & Plan Note (Signed)
Moods overall doing well, mostly struggling with PMS cycles. Buspar prn for these times

## 2019-09-01 NOTE — Assessment & Plan Note (Signed)
Will try re-sending lyrica, small supply of gabapentin sent in case not covered. Refilled robaxin for prn use

## 2019-09-01 NOTE — Assessment & Plan Note (Signed)
Not well controlled, add buspar for prn use and monitor for benefit

## 2019-09-05 ENCOUNTER — Encounter: Payer: 59 | Admitting: Family Medicine

## 2019-09-09 ENCOUNTER — Encounter: Payer: 59 | Admitting: Family Medicine

## 2019-09-13 ENCOUNTER — Encounter: Payer: 59 | Admitting: Family Medicine

## 2019-09-23 ENCOUNTER — Other Ambulatory Visit: Payer: Self-pay

## 2019-09-23 ENCOUNTER — Encounter: Payer: Self-pay | Admitting: Gastroenterology

## 2019-09-26 ENCOUNTER — Other Ambulatory Visit
Admission: RE | Admit: 2019-09-26 | Discharge: 2019-09-26 | Disposition: A | Discharge status: Home / Self Care | Payer: 59 | Source: Ambulatory Visit | Attending: Gastroenterology | Admitting: Gastroenterology

## 2019-09-26 ENCOUNTER — Other Ambulatory Visit: Payer: Self-pay

## 2019-09-26 DIAGNOSIS — Z01812 Encounter for preprocedural laboratory examination: Secondary | ICD-10-CM | POA: Insufficient documentation

## 2019-09-26 DIAGNOSIS — Z20822 Contact with and (suspected) exposure to covid-19: Secondary | ICD-10-CM | POA: Insufficient documentation

## 2019-09-27 LAB — SARS CORONAVIRUS 2 (TAT 6-24 HRS): SARS Coronavirus 2: NEGATIVE

## 2019-09-27 NOTE — Discharge Instructions (Signed)
General Anesthesia, Adult, Care After This sheet gives you information about how to care for yourself after your procedure. Your health care provider may also give you more specific instructions. If you have problems or questions, contact your health care provider. What can I expect after the procedure? After the procedure, the following side effects are common:  Pain or discomfort at the IV site.  Nausea.  Vomiting.  Sore throat.  Trouble concentrating.  Feeling cold or chills.  Weak or tired.  Sleepiness and fatigue.  Soreness and body aches. These side effects can affect parts of the body that were not involved in surgery. Follow these instructions at home:  For at least 24 hours after the procedure:  Have a responsible adult stay with you. It is important to have someone help care for you until you are awake and alert.  Rest as needed.  Do not: ? Participate in activities in which you could fall or become injured. ? Drive. ? Use heavy machinery. ? Drink alcohol. ? Take sleeping pills or medicines that cause drowsiness. ? Make important decisions or sign legal documents. ? Take care of children on your own. Eating and drinking  Follow any instructions from your health care provider about eating or drinking restrictions.  When you feel hungry, start by eating small amounts of foods that are soft and easy to digest (bland), such as toast. Gradually return to your regular diet.  Drink enough fluid to keep your urine pale yellow.  If you vomit, rehydrate by drinking water, juice, or clear broth. General instructions  If you have sleep apnea, surgery and certain medicines can increase your risk for breathing problems. Follow instructions from your health care provider about wearing your sleep device: ? Anytime you are sleeping, including during daytime naps. ? While taking prescription pain medicines, sleeping medicines, or medicines that make you drowsy.  Return to  your normal activities as told by your health care provider. Ask your health care provider what activities are safe for you.  Take over-the-counter and prescription medicines only as told by your health care provider.  If you smoke, do not smoke without supervision.  Keep all follow-up visits as told by your health care provider. This is important. Contact a health care provider if:  You have nausea or vomiting that does not get better with medicine.  You cannot eat or drink without vomiting.  You have pain that does not get better with medicine.  You are unable to pass urine.  You develop a skin rash.  You have a fever.  You have redness around your IV site that gets worse. Get help right away if:  You have difficulty breathing.  You have chest pain.  You have blood in your urine or stool, or you vomit blood. Summary  After the procedure, it is common to have a sore throat or nausea. It is also common to feel tired.  Have a responsible adult stay with you for the first 24 hours after general anesthesia. It is important to have someone help care for you until you are awake and alert.  When you feel hungry, start by eating small amounts of foods that are soft and easy to digest (bland), such as toast. Gradually return to your regular diet.  Drink enough fluid to keep your urine pale yellow.  Return to your normal activities as told by your health care provider. Ask your health care provider what activities are safe for you. This information is not   intended to replace advice given to you by your health care provider. Make sure you discuss any questions you have with your health care provider. Document Revised: 04/07/2017 Document Reviewed: 11/18/2016 Elsevier Patient Education  2020 Elsevier Inc.  

## 2019-09-30 ENCOUNTER — Ambulatory Visit
Admission: RE | Admit: 2019-09-30 | Discharge: 2019-09-30 | Disposition: A | Discharge status: Home / Self Care | Payer: 59 | Attending: Gastroenterology | Admitting: Gastroenterology

## 2019-09-30 ENCOUNTER — Ambulatory Visit: Payer: 59 | Admitting: Anesthesiology

## 2019-09-30 ENCOUNTER — Encounter
Admission: RE | Disposition: A | Discharge status: Home / Self Care | Payer: Self-pay | Source: Home / Self Care | Attending: Gastroenterology

## 2019-09-30 ENCOUNTER — Other Ambulatory Visit: Payer: Self-pay

## 2019-09-30 ENCOUNTER — Encounter: Payer: Self-pay | Admitting: Gastroenterology

## 2019-09-30 DIAGNOSIS — M797 Fibromyalgia: Secondary | ICD-10-CM | POA: Diagnosis not present

## 2019-09-30 DIAGNOSIS — G43909 Migraine, unspecified, not intractable, without status migrainosus: Secondary | ICD-10-CM | POA: Insufficient documentation

## 2019-09-30 DIAGNOSIS — F329 Major depressive disorder, single episode, unspecified: Secondary | ICD-10-CM | POA: Diagnosis not present

## 2019-09-30 DIAGNOSIS — F419 Anxiety disorder, unspecified: Secondary | ICD-10-CM | POA: Diagnosis not present

## 2019-09-30 DIAGNOSIS — Z825 Family history of asthma and other chronic lower respiratory diseases: Secondary | ICD-10-CM | POA: Diagnosis not present

## 2019-09-30 DIAGNOSIS — F17210 Nicotine dependence, cigarettes, uncomplicated: Secondary | ICD-10-CM | POA: Insufficient documentation

## 2019-09-30 DIAGNOSIS — Z8371 Family history of colonic polyps: Secondary | ICD-10-CM | POA: Insufficient documentation

## 2019-09-30 DIAGNOSIS — Z882 Allergy status to sulfonamides status: Secondary | ICD-10-CM | POA: Insufficient documentation

## 2019-09-30 DIAGNOSIS — Z831 Family history of other infectious and parasitic diseases: Secondary | ICD-10-CM | POA: Diagnosis not present

## 2019-09-30 DIAGNOSIS — Z79899 Other long term (current) drug therapy: Secondary | ICD-10-CM | POA: Diagnosis not present

## 2019-09-30 DIAGNOSIS — K640 First degree hemorrhoids: Secondary | ICD-10-CM | POA: Insufficient documentation

## 2019-09-30 DIAGNOSIS — Z806 Family history of leukemia: Secondary | ICD-10-CM | POA: Insufficient documentation

## 2019-09-30 DIAGNOSIS — Z1211 Encounter for screening for malignant neoplasm of colon: Secondary | ICD-10-CM | POA: Insufficient documentation

## 2019-09-30 DIAGNOSIS — K635 Polyp of colon: Secondary | ICD-10-CM

## 2019-09-30 DIAGNOSIS — K219 Gastro-esophageal reflux disease without esophagitis: Secondary | ICD-10-CM | POA: Insufficient documentation

## 2019-09-30 HISTORY — DX: Family history of other specified conditions: Z84.89

## 2019-09-30 HISTORY — PX: COLONOSCOPY WITH PROPOFOL: SHX5780

## 2019-09-30 HISTORY — DX: Migraine, unspecified, not intractable, without status migrainosus: G43.909

## 2019-09-30 HISTORY — DX: Fibromyalgia: M79.7

## 2019-09-30 LAB — POCT PREGNANCY, URINE: Preg Test, Ur: NEGATIVE

## 2019-09-30 SURGERY — COLONOSCOPY WITH PROPOFOL
Anesthesia: General | Site: Rectum

## 2019-09-30 MED ORDER — ACETAMINOPHEN 325 MG PO TABS: 325.0000 mg | ORAL_TABLET | ORAL | Status: DC | PRN

## 2019-09-30 MED ORDER — PROPOFOL 10 MG/ML IV BOLUS
INTRAVENOUS | Status: DC | PRN
  Administered 2019-09-30 (×3): 30 mg via INTRAVENOUS
  Administered 2019-09-30: 20 mg via INTRAVENOUS
  Administered 2019-09-30: 100 mg via INTRAVENOUS
  Administered 2019-09-30 (×2): 20 mg via INTRAVENOUS

## 2019-09-30 MED ORDER — LACTATED RINGERS IV SOLN: INTRAVENOUS | Status: DC

## 2019-09-30 MED ORDER — LIDOCAINE HCL (CARDIAC) PF 100 MG/5ML IV SOSY
PREFILLED_SYRINGE | INTRAVENOUS | Status: DC | PRN
  Administered 2019-09-30: 40 mg via INTRAVENOUS

## 2019-09-30 MED ORDER — ONDANSETRON HCL 4 MG/2ML IJ SOLN: 4.0000 mg | Freq: Once | INTRAMUSCULAR | Status: DC | PRN

## 2019-09-30 MED ORDER — STERILE WATER FOR IRRIGATION IR SOLN
Status: DC | PRN
  Administered 2019-09-30: 50 mL

## 2019-09-30 MED ORDER — ACETAMINOPHEN 160 MG/5ML PO SOLN: 325.0000 mg | ORAL | Status: DC | PRN

## 2019-09-30 SURGICAL SUPPLY — 24 items
CLIP HMST 235XBRD CATH ROT (MISCELLANEOUS) IMPLANT
CLIP RESOLUTION 360 11X235 (MISCELLANEOUS)
ELECT REM PT RETURN 9FT ADLT (ELECTROSURGICAL)
ELECTRODE REM PT RTRN 9FT ADLT (ELECTROSURGICAL) IMPLANT
FCP ESCP3.2XJMB 240X2.8X (MISCELLANEOUS)
FORCEPS BIOP RAD 4 LRG CAP 4 (CUTTING FORCEPS) ×3 IMPLANT
FORCEPS BIOP RJ4 240 W/NDL (MISCELLANEOUS)
FORCEPS ESCP3.2XJMB 240X2.8X (MISCELLANEOUS) IMPLANT
GOWN CVR UNV OPN BCK APRN NK (MISCELLANEOUS) ×2 IMPLANT
GOWN ISOL THUMB LOOP REG UNIV (MISCELLANEOUS) ×4
INJECTOR VARIJECT VIN23 (MISCELLANEOUS) IMPLANT
KIT DEFENDO VALVE AND CONN (KITS) IMPLANT
KIT ENDO PROCEDURE OLY (KITS) ×3 IMPLANT
MANIFOLD NEPTUNE II (INSTRUMENTS) IMPLANT
MARKER SPOT ENDO TATTOO 5ML (MISCELLANEOUS) IMPLANT
PROBE APC STR FIRE (PROBE) IMPLANT
RETRIEVER NET ROTH 2.5X230 LF (MISCELLANEOUS) IMPLANT
SNARE SHORT THROW 13M SML OVAL (MISCELLANEOUS) IMPLANT
SNARE SHORT THROW 30M LRG OVAL (MISCELLANEOUS) IMPLANT
SNARE SNG USE RND 15MM (INSTRUMENTS) IMPLANT
SPOT EX ENDOSCOPIC TATTOO (MISCELLANEOUS)
TRAP ETRAP POLY (MISCELLANEOUS) IMPLANT
VARIJECT INJECTOR VIN23 (MISCELLANEOUS)
WATER STERILE IRR 250ML POUR (IV SOLUTION) ×3 IMPLANT

## 2019-09-30 NOTE — Anesthesia Postprocedure Evaluation (Signed)
Anesthesia Post Note  Patient: Daylah Kinlaw  Procedure(s) Performed: COLONOSCOPY WITH PROPOFOL, POLYPECTOMY (N/A Rectum)     Patient location during evaluation: PACU Anesthesia Type: General Level of consciousness: awake Pain management: pain level controlled Vital Signs Assessment: post-procedure vital signs reviewed and stable Respiratory status: respiratory function stable Cardiovascular status: stable Postop Assessment: no signs of nausea or vomiting Anesthetic complications: no   Right eye is slightly erythematous and tearing. Suspect corneal abrasion. Instructed patient to use lubricating drops and gave gauze/tape to cover eye and keep closed as much as possible today. If not improved in 24 hours, should seek ophthalmologic evaluation.  No complications documented.  Jola Babinski

## 2019-09-30 NOTE — Transfer of Care (Signed)
Immediate Anesthesia Transfer of Care Note  Patient: Heather Costa  Procedure(s) Performed: COLONOSCOPY WITH PROPOFOL, POLYPECTOMY (N/A Rectum)  Patient Location: PACU  Anesthesia Type: General  Level of Consciousness: awake, alert  and patient cooperative  Airway and Oxygen Therapy: Patient Spontanous Breathing and Patient connected to supplemental oxygen  Post-op Assessment: Post-op Vital signs reviewed, Patient's Cardiovascular Status Stable, Respiratory Function Stable, Patent Airway and No signs of Nausea or vomiting  Post-op Vital Signs: Reviewed and stable  Complications: No complications documented.

## 2019-09-30 NOTE — Anesthesia Procedure Notes (Signed)
Procedure Name: MAC Date/Time: 09/30/2019 8:01 AM Performed by: Vanetta Shawl, CRNA Pre-anesthesia Checklist: Patient identified, Emergency Drugs available, Suction available, Timeout performed and Patient being monitored Patient Re-evaluated:Patient Re-evaluated prior to induction Oxygen Delivery Method: Nasal cannula Placement Confirmation: positive ETCO2

## 2019-09-30 NOTE — Anesthesia Preprocedure Evaluation (Signed)
Anesthesia Evaluation  Patient identified by MRN, date of birth, ID band Patient awake    Reviewed: Allergy & Precautions, NPO status   Airway Mallampati: II  TM Distance: >3 FB     Dental   Pulmonary Current Smoker and Patient abstained from smoking.,    breath sounds clear to auscultation       Cardiovascular negative cardio ROS   Rhythm:Regular Rate:Normal     Neuro/Psych  Headaches, Anxiety Depression    GI/Hepatic GERD  ,  Endo/Other    Renal/GU      Musculoskeletal  (+) Fibromyalgia -  Abdominal   Peds  Hematology   Anesthesia Other Findings   Reproductive/Obstetrics                             Anesthesia Physical Anesthesia Plan  ASA: II  Anesthesia Plan: General   Post-op Pain Management:    Induction: Intravenous  PONV Risk Score and Plan: Propofol infusion, TIVA and Treatment may vary due to age or medical condition  Airway Management Planned: Natural Airway and Nasal Cannula  Additional Equipment:   Intra-op Plan:   Post-operative Plan:   Informed Consent: I have reviewed the patients History and Physical, chart, labs and discussed the procedure including the risks, benefits and alternatives for the proposed anesthesia with the patient or authorized representative who has indicated his/her understanding and acceptance.       Plan Discussed with: CRNA  Anesthesia Plan Comments:         Anesthesia Quick Evaluation

## 2019-09-30 NOTE — Op Note (Signed)
[facility] Gastroenterology Patient Name: Heather Costa Procedure Date: 09/30/2019 7:47 AM MRN: [id number] Account #: [id number] Date of Birth: 1970/09/14 Admit Type: Outpatient Age: 49 Room: [facility] OR ROOM 01 Gender: Female Note Status: Finalized Procedure:             Colonoscopy Indications:           Family history of advanced adenoma of the colon in a                         first-degree relative before age 37 years Providers:             Midge Minium MD, MD Referring MD:          Particia Nearing (Referring MD) Medicines:             Propofol per Anesthesia Complications:         No immediate complications. Procedure:             Pre-Anesthesia Assessment:                        - Prior to the procedure, a History and Physical was                         performed, and patient medications and allergies were                         reviewed. The patient's tolerance of previous                         anesthesia was also reviewed. The risks and benefits                         of the procedure and the sedation options and risks                         were discussed with the patient. All questions were                         answered, and informed consent was obtained. Prior                         Anticoagulants: The patient has taken no previous                         anticoagulant or antiplatelet agents. ASA Grade                         Assessment: II - A patient with mild systemic disease.                         After reviewing the risks and benefits, the patient                         was deemed in satisfactory condition to undergo the                         procedure.  After obtaining informed consent, the colonoscope was                         passed under direct vision. Throughout the procedure,                         the patient's blood pressure, pulse, and oxygen                         saturations were monitored  continuously. The was                         introduced through the anus and advanced to the the                         terminal ileum. The colonoscopy was performed without                         difficulty. The patient tolerated the procedure well.                         The quality of the bowel preparation was excellent. Findings:      The perianal and digital rectal examinations were normal.      A 4 mm polyp was found in the sigmoid colon. The polyp was sessile. The       polyp was removed with a cold biopsy forceps. Resection and retrieval       were complete.      Non-bleeding internal hemorrhoids were found during retroflexion. The       hemorrhoids were Grade I (internal hemorrhoids that do not prolapse).      The terminal ileum appeared normal. Impression:            - One 4 mm polyp in the sigmoid colon, removed with a                         cold biopsy forceps. Resected and retrieved.                        - Non-bleeding internal hemorrhoids.                        - The examined portion of the ileum was normal. Recommendation:        - Discharge patient to home.                        - Resume previous diet.                        - Continue present medications.                        - Await pathology results.                        - Repeat colonoscopy in 5 years for surveillance. Procedure Code(s):     --- Professional ---                        [phone], Colonoscopy, flexible; with biopsy,  single or                         multiple Diagnosis Code(s):     --- Professional ---                        Z83.71, Family history of colonic polyps                        K63.5, Polyp of colon CPT copyright 2019 American Medical Association. All rights reserved. The codes documented in this report are preliminary and upon coder review may  be revised to meet current compliance requirements. Midge Minium MD, MD 09/30/2019 8:18:50 AM This report has been signed  electronically. Number of Addenda: 0 Note Initiated On: 09/30/2019 7:47 AM Scope Withdrawal Time: 0 hours 7 minutes 46 seconds  Total Procedure Duration: 0 hours 13 minutes 6 seconds  Estimated Blood Loss:  Estimated blood loss: none.      [facility]

## 2019-09-30 NOTE — H&P (Signed)
[name], MD [address] [place], [place] [address] Phone:[phone] Fax : [phone]  Primary Care Physician:  Volney American, PA-C Primary Gastroenterologist:  Dr. Allen Norris  Pre-Procedure History & Physical: HPI:  Heather Costa is a 49 y.o. female is here for an colonoscopy.   Past Medical History:  Diagnosis Date  . Allergy   . Depression   . Depression with anxiety   . Family history of adverse reaction to anesthesia    Father - slow to wake  . Fibromyalgia   . GERD (gastroesophageal reflux disease)   . Migraine headache    1-2x/mo  . Neuromuscular disorder [facility])     Past Surgical History:  Procedure Laterality Date  . Dysplasia Procedures    . TUBAL LIGATION      Prior to Admission medications   Medication Sig Start Date End Date Taking? Authorizing Provider  omeprazole (PRILOSEC) 40 MG capsule Take 1 capsule (40 mg total) by mouth daily. 08/26/19  Yes Volney American, PA-C  busPIRone (BUSPAR) 5 MG tablet Take 1 tablet (5 mg total) by mouth 3 (three) times daily as needed. Patient not taking: Reported on 09/23/2019 08/26/19   Volney American, PA-C  gabapentin (NEURONTIN) 300 MG capsule Take 1 capsule (300 mg total) by mouth 3 (three) times daily. Patient not taking: Reported on 09/23/2019 08/26/19   Volney American, PA-C  hydrOXYzine (ATARAX/VISTARIL) 25 MG tablet Take 1 tablet by mouth three times daily as needed Patient not taking: Reported on 08/26/2019 01/26/19   Volney American, PA-C  methocarbamol (ROBAXIN) 500 MG tablet Take 1 tablet (500 mg total) by mouth 3 (three) times daily. Patient not taking: Reported on 09/23/2019 08/26/19   Volney American, PA-C  pregabalin (LYRICA) 75 MG capsule Take 1 capsule (75 mg total) by mouth 2 (two) times daily. Patient not taking: Reported on 08/28/2019 08/26/19   Volney American, PA-C  SUMAtriptan (IMITREX) 50 MG tablet TAKE 1 TABLET BY MOUTH EVERY 2 HOURS AS  NEEDED FOR MIGRAINE. MAY REPEAT IN 2 HOURS IF HEADACHE PERSISTS OR RECURS. Patient not taking: TAKE 1 TABLET BY MOUTH EVERY 2 HOURS AS NEEDED FOR MIGRAINE. MAY REPEAT IN 2 HOURS IF HEADACHE PERSISTS OR RECURS. 07/18/18   Volney American, PA-C    Allergies as of 08/28/2019 - Review Complete 08/28/2019  Allergen Reaction Noted  . Sulfa antibiotics Anaphylaxis 01/23/2017    Family History  Problem Relation Age of Onset  . Healthy Mother   . Healthy Father   . Leukemia Paternal Uncle   . Emphysema Paternal Grandmother   . Emphysema Paternal Grandfather     Social History   Socioeconomic History  . Marital status: Married    Spouse name: Not on file  . Number of children: Not on file  . Years of education: Not on file  . Highest education level: Not on file  Occupational History  . Not on file  Tobacco Use  . Smoking status: Current Every Day Smoker    Packs/day: 1.00    Years: 30.00    Pack years: 30.00    Types: Cigarettes  . Smokeless tobacco: Never Used  . Tobacco comment: since age 21  Vaping Use  . Vaping Use: Never used  Substance and Sexual Activity  . Alcohol use: Yes    Alcohol/week: 4.0 standard drinks    Types: 4 Cans of beer per week  . Drug use: No  . Sexual activity: Not on file  Other Topics Concern  . Not on file  Social History Narrative  . Not on file   Social Determinants of Health   Financial Resource Strain:   . Difficulty of Paying Living Expenses:   Food Insecurity:   . Worried About Programme researcher, broadcasting/film/video in the Last Year:   . Barista in the Last Year:   Transportation Needs:   . Freight forwarder (Medical):   Marland Kitchen Lack of Transportation (Non-Medical):   Physical Activity:   . Days of Exercise per Week:   . Minutes of Exercise per Session:   Stress:   . Feeling of Stress :   Social Connections:   . Frequency of Communication with Friends and Family:   . Frequency of Social Gatherings with Friends and Family:   .  Attends Religious Services:   . Active Member of Clubs or Organizations:   . Attends Banker Meetings:   Marland Kitchen Marital Status:   Intimate Partner Violence:   . Fear of Current or Ex-Partner:   . Emotionally Abused:   Marland Kitchen Physically Abused:   . Sexually Abused:     Review of Systems: See HPI, otherwise negative ROS  Physical Exam: BP 103/79   Pulse (!) 56   Temp 97.9 F (36.6 C) (Temporal)   Resp 18   Ht 4' 11.02" (1.499 m)   Wt 55.8 kg   LMP 07/15/2019 (Approximate)   SpO2 100%   BMI 24.83 kg/m  General:   Alert,  pleasant and cooperative in NAD Head:  Normocephalic and atraumatic. Neck:  Supple; no masses or thyromegaly. Lungs:  Clear throughout to auscultation.    Heart:  Regular rate and rhythm. Abdomen:  Soft, nontender and nondistended. Normal bowel sounds, without guarding, and without rebound.   Neurologic:  Alert and  oriented x4;  grossly normal neurologically.  Impression/Plan: Heather Costa is here for an colonoscopy to be performed for family history of polyps  Risks, benefits, limitations, and alternatives regarding  colonoscopy have been reviewed with the patient.  Questions have been answered.  All parties agreeable.   Midge Minium, MD  09/30/2019, 7:57 AM

## 2019-10-02 LAB — SURGICAL PATHOLOGY

## 2019-10-03 ENCOUNTER — Encounter: Payer: Self-pay | Admitting: Gastroenterology

## 2020-01-23 ENCOUNTER — Telehealth: Payer: 59 | Admitting: Unknown Physician Specialty

## 2020-04-30 ENCOUNTER — Telehealth: Payer: Self-pay

## 2020-04-30 NOTE — Telephone Encounter (Signed)
Called pt because she sent a message about wanting a covid test no answer left vm

## 2020-05-01 ENCOUNTER — Telehealth (INDEPENDENT_AMBULATORY_CARE_PROVIDER_SITE_OTHER): Payer: 59 | Admitting: Family Medicine

## 2020-05-01 ENCOUNTER — Encounter: Payer: Self-pay | Admitting: Family Medicine

## 2020-05-01 DIAGNOSIS — Z20822 Contact with and (suspected) exposure to covid-19: Secondary | ICD-10-CM | POA: Diagnosis not present

## 2020-05-01 MED ORDER — CYCLOBENZAPRINE HCL 10 MG PO TABS: 10.0000 mg | ORAL_TABLET | Freq: Every day | ORAL | 0 refills | Status: DC

## 2020-05-01 MED ORDER — NAPROXEN 500 MG PO TABS
500.0000 mg | ORAL_TABLET | Freq: Two times a day (BID) | ORAL | 1 refills | Status: DC
Start: 2020-05-01 — End: 2020-10-22

## 2020-05-01 NOTE — Progress Notes (Signed)
There were no vitals taken for this visit.   Subjective:    [name], female    DOB: 1971/03/11, 50 y.o.   MRN: [id number]  HPI: Heather Costa is a 50 y.o. female  Chief Complaint  Patient presents with  . URI    Pt states she started having weakness and fatigue Tuesday night/Wednesday morning. States she has started coughing and having a headache. States an employee did have covid recently and customers did as well.    UPPER RESPIRATORY TRACT INFECTION Duration: 3 days Worst symptom: weakness and body aches Fever: no Cough: yes Shortness of breath: no Wheezing: no Chest pain: no Chest tightness: no Chest congestion: yes Nasal congestion: yes Runny nose: no Post nasal drip: yes Sneezing: yes Sore throat: yes Swollen glands: no Sinus pressure: no Headache: yes Face pain: no Toothache: yes Ear pain: no  Ear pressure: yes bilateral Eyes red/itching:no Eye drainage/crusting: no  Vomiting: no Rash: no Fatigue: yes Sick contacts: yes Strep contacts: no  Context: worse Recurrent sinusitis: no Relief with OTC cold/cough medications: no  Treatments attempted: none   Relevant past medical, surgical, family and social history reviewed and updated as indicated. Interim medical history since our last visit reviewed. Allergies and medications reviewed and updated.  Review of Systems  Constitutional: Positive for fatigue. Negative for activity change, appetite change, chills, diaphoresis, fever and unexpected weight change.  HENT: Negative.   Respiratory: Positive for cough. Negative for apnea, choking, chest tightness, shortness of breath, wheezing and stridor.   Cardiovascular: Negative.   Gastrointestinal: Negative.   Musculoskeletal: Positive for arthralgias, myalgias, neck pain and neck stiffness. Negative for back pain, gait problem and joint swelling.  Skin: Negative.   Neurological: Negative.   Psychiatric/Behavioral: Negative.     Per  HPI unless specifically indicated above     Objective:    There were no vitals taken for this visit.  Wt Readings from Last 3 Encounters:  09/30/19 123 lb (55.8 kg)  08/26/19 125 lb 8 oz (56.9 kg)  03/14/18 135 lb 4 oz (61.3 kg)    Physical Exam Vitals and nursing note reviewed.  Pulmonary:     Effort: Pulmonary effort is normal. No respiratory distress.     Comments: Speaking in full sentences Neurological:     Mental Status: She is alert.  Psychiatric:        Mood and Affect: Mood normal.        Behavior: Behavior normal.        Thought Content: Thought content normal.        Judgment: Judgment normal.     Results for orders placed or performed during the hospital encounter of 09/30/19  Pregnancy, urine POC  Result Value Ref Range   Preg Test, Ur NEGATIVE NEGATIVE  Surgical pathology  Result Value Ref Range   SURGICAL PATHOLOGY      SURGICAL PATHOLOGY CASE: [id number] PATIENT: [facility] Surgical Pathology Report     Specimen Submitted: A. Colon polyp, sigmoid; cbx  Clinical History: Screening colonoscopy.  Colon polyps    DIAGNOSIS: A. COLON POLYP, SIGMOID; COLD BIOPSY: - MINUTE HYPERPLASTIC POLYP. - NEGATIVE FOR DYSPLASIA AND MALIGNANCY.  Comment: Multiple additional deeper recut levels were examined.  GROSS DESCRIPTION: A. Labeled: Sigmoid colon polyp, CB Received: Formalin Tissue fragment(s): 1 Size: 0.4 cm Description: Tan soft tissue fragment Entirely submitted in 1 cassette.    Final Diagnosis performed by Katherine Mantle, MD.   Electronically signed 10/02/2019 4:32:54PM The electronic signature  indicates that the named Attending Pathologist has evaluated the specimen Technical component performed at [facility], [address] [place], [place] [address] Lab: [phone] Dir: Jolene Schimke, MD, MMM  Professional component performed at [facility], [facility], [address] [place], [place], [place] [address] Lab:  [phone] Dir: Georgiann Cocker. Oneita Kras, MD       Assessment & Plan:   Problem List Items Addressed This Visit   None   Visit Diagnoses    Suspected COVID-19 virus infection    -  Primary   Swabbed today. Self-quarantine until results are back. Naproxen and flexeril for body aches. Call with any concerns or if not getting better or getting worse.    Relevant Orders   Novel Coronavirus, NAA ([facility])       Follow up plan: Return if symptoms worsen or fail to improve.    . This visit was completed via telephone due to the restrictions of the COVID-19 pandemic. All issues as above were discussed and addressed but no physical exam was performed. If it was felt that the patient should be evaluated in the office, they were directed there. The patient verbally consented to this visit. Patient was unable to complete an audio/visual visit due to Lack of equipment. Due to the catastrophic nature of the COVID-19 pandemic, this visit was done through audio contact only. . Location of the patient: parking lot . Location of the provider: home . Those involved with this call:  . Provider: Olevia Perches, DO . CMA: Tristan Schroeder, CMA . Front Desk/Registration: Harriet Pho  . Time spent on call: 21 minutes with patient face to face via video conference. More than 50% of this time was spent in counseling and coordination of care. 30 minutes total spent in review of patient's record and preparation of their chart.

## 2020-05-01 NOTE — Addendum Note (Signed)
Addended by: Leward Quan A on: 05/01/2020 11:20 AM   Modules accepted: Orders

## 2020-05-01 NOTE — Progress Notes (Signed)
Called patient 3x at prearranged time after CMA had roomed her. Calls went straight to VM. LMOM to call back to have visit.

## 2020-05-01 NOTE — Addendum Note (Signed)
Addended by: Dorcas Carrow on: 05/01/2020 11:32 AM   Modules accepted: Orders, Level of Service

## 2020-05-04 LAB — NOVEL CORONAVIRUS, NAA: SARS-CoV-2, NAA: NOT DETECTED

## 2020-08-07 ENCOUNTER — Emergency Department
Admission: EM | Admit: 2020-08-07 | Discharge: 2020-08-07 | Disposition: A | Discharge status: Home / Self Care | Payer: No Typology Code available for payment source | Attending: Physician Assistant | Admitting: Physician Assistant

## 2020-08-07 ENCOUNTER — Emergency Department: Payer: No Typology Code available for payment source

## 2020-08-07 ENCOUNTER — Other Ambulatory Visit: Payer: Self-pay

## 2020-08-07 DIAGNOSIS — S0591XA Unspecified injury of right eye and orbit, initial encounter: Secondary | ICD-10-CM | POA: Diagnosis present

## 2020-08-07 DIAGNOSIS — S0501XA Injury of conjunctiva and corneal abrasion without foreign body, right eye, initial encounter: Secondary | ICD-10-CM | POA: Diagnosis not present

## 2020-08-07 DIAGNOSIS — S0083XA Contusion of other part of head, initial encounter: Secondary | ICD-10-CM

## 2020-08-07 DIAGNOSIS — W2209XA Striking against other stationary object, initial encounter: Secondary | ICD-10-CM | POA: Insufficient documentation

## 2020-08-07 DIAGNOSIS — F17210 Nicotine dependence, cigarettes, uncomplicated: Secondary | ICD-10-CM | POA: Insufficient documentation

## 2020-08-07 DIAGNOSIS — Y990 Civilian activity done for income or pay: Secondary | ICD-10-CM | POA: Diagnosis not present

## 2020-08-07 MED ORDER — TETRACAINE HCL 0.5 % OP SOLN
2.0000 [drp] | Freq: Once | OPHTHALMIC | Status: AC
  Administered 2020-08-07: 2 [drp] via OPHTHALMIC
  Filled 2020-08-07: qty 4

## 2020-08-07 MED ORDER — EYE WASH OPHTH SOLN
1.0000 [drp] | OPHTHALMIC | Status: DC | PRN
  Administered 2020-08-07: 1 [drp] via OPHTHALMIC
  Filled 2020-08-07: qty 118

## 2020-08-07 MED ORDER — GENTAMICIN SULFATE 0.3 % OP SOLN
1.0000 [drp] | OPHTHALMIC | 0 refills | Status: DC
Start: 2020-08-07 — End: 2020-10-14

## 2020-08-07 MED ORDER — FLUORESCEIN SODIUM 1 MG OP STRP
1.0000 | ORAL_STRIP | Freq: Once | OPHTHALMIC | Status: AC
  Administered 2020-08-07: 1 via OPHTHALMIC
  Filled 2020-08-07: qty 1

## 2020-08-07 NOTE — ED Triage Notes (Signed)
Pt to ED POV for injury to right eye, hit eye on rack at work while bending over. 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 Redness noted to right eye

## 2020-08-07 NOTE — ED Provider Notes (Signed)
[facility]  Emergency Department Provider Note   ____________________________________________   Event Date/Time   First MD Initiated Contact with Patient 08/07/20 [phone]     (approximate)  I have reviewed the triage vital signs and the nursing notes.   HISTORY  Chief Complaint Eye Problem    HPI Heather Costa is a 50 y.o. female patient complaining her right eye injury secondary to hit her eye on a rack at work while bending over.  Patient denies vision disturbance.  Patient state increased redness to the right eye.  Patient also noticed swelling in inferior aspect of the right orbital area.         Past Medical History:  Diagnosis Date  . Allergy   . Depression   . Depression with anxiety   . Family history of adverse reaction to anesthesia    Father - slow to wake  . Fibromyalgia   . GERD (gastroesophageal reflux disease)   . Migraine headache    1-2x/mo  . Neuromuscular disorder [facility])     Patient Active Problem List   Diagnosis Date Noted  . Family history of infectious and parasitic diseases   . Polyp of sigmoid colon   . GERD (gastroesophageal reflux disease) 03/19/2018  . Migraine 05/22/2017  . Depression 04/24/2017  . Fibromyalgia 04/24/2017  . Anxiety 04/24/2017    Past Surgical History:  Procedure Laterality Date  . COLONOSCOPY WITH PROPOFOL N/A 09/30/2019   Procedure: COLONOSCOPY WITH PROPOFOL, POLYPECTOMY;  Surgeon: Midge Minium, MD;  Location: [facility];  Service: Endoscopy;  Laterality: N/A;  . Dysplasia Procedures    . TUBAL LIGATION      Prior to Admission medications   Medication Sig Start Date End Date Taking? Authorizing Provider  gentamicin (GARAMYCIN) 0.3 % ophthalmic solution Place 1 drop into the right eye every 4 (four) hours. 08/07/20  Yes Joni Reining, PA-C  busPIRone (BUSPAR) 5 MG tablet Take 1 tablet (5 mg total) by mouth 3 (three) times daily as needed. Patient not taking: No sig reported  08/26/19   Particia Nearing, PA-C  cyclobenzaprine (FLEXERIL) 10 MG tablet Take 1 tablet (10 mg total) by mouth at bedtime. 05/01/20   Johnson, Megan P, DO  gabapentin (NEURONTIN) 300 MG capsule Take 1 capsule (300 mg total) by mouth 3 (three) times daily. Patient not taking: No sig reported 08/26/19   Particia Nearing, PA-C  hydrOXYzine (ATARAX/VISTARIL) 25 MG tablet Take 1 tablet by mouth three times daily as needed Patient not taking: No sig reported 01/26/19   Particia Nearing, PA-C  methocarbamol (ROBAXIN) 500 MG tablet Take 1 tablet (500 mg total) by mouth 3 (three) times daily. Patient not taking: No sig reported 08/26/19   Particia Nearing, PA-C  naproxen (NAPROSYN) 500 MG tablet Take 1 tablet (500 mg total) by mouth 2 (two) times daily with a meal. 05/01/20   Johnson, Megan P, DO  omeprazole (PRILOSEC) 40 MG capsule Take 1 capsule (40 mg total) by mouth daily. Patient not taking: Reported on 05/01/2020 08/26/19   Particia Nearing, PA-C  SUMAtriptan (IMITREX) 50 MG tablet TAKE 1 TABLET BY MOUTH EVERY 2 HOURS AS NEEDED FOR MIGRAINE. MAY REPEAT IN 2 HOURS IF HEADACHE PERSISTS OR RECURS. Patient not taking: No sig reported 07/18/18   Particia Nearing, PA-C    Allergies Sulfa antibiotics  Family History  Problem Relation Age of Onset  . Healthy Mother   . Healthy Father   . Leukemia Paternal Uncle   .  Emphysema Paternal Grandmother   . Emphysema Paternal Grandfather     Social History Social History   Tobacco Use  . Smoking status: Current Every Day Smoker    Packs/day: 0.50    Years: 30.00    Pack years: 15.00    Types: Cigarettes  . Smokeless tobacco: Never Used  . Tobacco comment: since age 28  Vaping Use  . Vaping Use: Never used  Substance Use Topics  . Alcohol use: Yes    Alcohol/week: 4.0 standard drinks    Types: 4 Cans of beer per week  . Drug use: No    Review of Systems Constitutional: No fever/chills Eyes: No visual changes.   Right pain. ENT: No sore throat. Cardiovascular: Denies chest pain. Respiratory: Denies shortness of breath. Gastrointestinal: No abdominal pain.  No nausea, no vomiting.  No diarrhea.  No constipation. Genitourinary: Negative for dysuria. Musculoskeletal: Negative for back pain. Skin: Negative for rash. Neurological: Negative for headaches, focal weakness or numbness. Psychiatric:  Anxiety and depression Allergic/Immunilogical: Sulfur antibiotics  ____________________________________________   PHYSICAL EXAM:  VITAL SIGNS: ED Triage Vitals  Enc Vitals Group     BP 08/07/20 0906 140/79     Pulse Rate 08/07/20 0906 68     Resp 08/07/20 0906 18     Temp 08/07/20 0906 97.9 F (36.6 C)     Temp Source 08/07/20 0906 Oral     SpO2 08/07/20 0906 100 %     Weight 08/07/20 0907 125 lb (56.7 kg)     Height 08/07/20 0907 4\' 11"  (1.499 m)     Head Circumference --      Peak Flow --      Pain Score 08/07/20 0906 5     Pain Loc --      Pain Edu? --      Excl. in GC? --     Constitutional: Alert and oriented. Well appearing and in no acute distress. Eyes: See visual acuity and nurses notes.  Right conjunctiva is erythematous. PERRL. EOMI. mild right cornea uptake with fluorescein stain. Head: Atraumatic. Cardiovascular: Normal rate, regular rhythm. Grossly normal heart sounds.  Good peripheral circulation. Respiratory: Normal respiratory effort.  No retractions. Lungs CTAB. Neurologic:  Normal speech and language. No gross focal neurologic deficits are appreciated. No gait instability. Skin:  Skin is warm, dry and intact. No rash noted. Psychiatric: Mood and affect are normal. Speech and behavior are normal.  ____________________________________________   LABS (all labs ordered are listed, but only abnormal results are displayed)  Labs Reviewed - No data to  display ____________________________________________  EKG   ____________________________________________  RADIOLOGY I, 08/09/20, personally viewed and evaluated these images (plain radiographs) as part of my medical decision making, as well as reviewing the written report by the radiologist.  ED MD interpretation:    Official radiology report(s): CT Maxillofacial Wo Contrast  Result Date: 08/07/2020 CLINICAL DATA:  Right orbital trauma EXAM: CT MAXILLOFACIAL WITHOUT CONTRAST TECHNIQUE: Multidetector CT imaging of the maxillofacial structures was performed. Multiplanar CT image reconstructions were also generated. COMPARISON:  None. FINDINGS: Osseous: Negative for fracture Orbits: Normal orbit. No fracture. No orbital mass or edema. No significant soft tissue swelling. Sinuses: Clear bilaterally. Soft tissues: No significant soft tissue swelling. Limited intracranial: Negative IMPRESSION: Negative CT maxillofacial.  Negative for fracture. Electronically Signed   By: 08/09/2020 M.D.   On: 08/07/2020 10:36    ____________________________________________   PROCEDURES  Procedure(s) performed (including Critical Care):  Procedures   ____________________________________________  INITIAL IMPRESSION / ASSESSMENT AND PLAN / ED COURSE  As part of my medical decision making, I reviewed the following data within the electronic MEDICAL RECORD NUMBER         Patient presents with right eye pain secondary to contusion facial contusion.  Visual acuity was measured with corrective lenses 20/25 bilaterally.  Patient has mild corneal abrasion right inferior lateral aspect of the eye.  No acute findings on maxillofacial CT.  Discussed these findings with patient.  Patient given discharge care instructions and consult to ophthalmology for reevaluation in 3 days.  Return to ED if condition worsens.     ____________________________________________   FINAL CLINICAL IMPRESSION(S) / ED  DIAGNOSES  Final diagnoses:  Abrasion of right cornea, initial encounter  Contusion of face, initial encounter     ED Discharge Orders         Ordered    gentamicin (GARAMYCIN) 0.3 % ophthalmic solution  Every 4 hours        08/07/20 1103          *Please note:  Heather Costa was evaluated in Emergency Department on 08/07/2020 for the symptoms described in the history of present illness. She was evaluated in the context of the global COVID-19 pandemic, which necessitated consideration that the patient might be at risk for infection with the SARS-CoV-2 virus that causes COVID-19. Institutional protocols and algorithms that pertain to the evaluation of patients at risk for COVID-19 are in a state of rapid change based on information released by regulatory bodies including the CDC and federal and state organizations. These policies and algorithms were followed during the patient's care in the ED.  Some ED evaluations and interventions may be delayed as a result of limited staffing during and the pandemic.*   Note:  This document was prepared using Dragon voice recognition software and may include unintentional dictation errors.    Joni Reining, PA-C 08/07/20 1109    Sharman Cheek, MD 08/08/20 [phone]

## 2020-08-07 NOTE — Discharge Instructions (Signed)
Follow discharge care instruction use eyedrops as directed.  Follow-up with ophthalmology in 3 days as directed.

## 2020-08-07 NOTE — ED Notes (Signed)
Pt verbalized understanding of d/c instructions at this time and was provided the opportunity to ask questions at this time. Pt ambulatory to ED lobby at this time, NAD noted, steady gait noted, RR even and unlabored.

## 2020-10-13 ENCOUNTER — Telehealth: Payer: Self-pay

## 2020-10-13 NOTE — Telephone Encounter (Signed)
Called pt to schedule mychart visit no answer left vm pt is positive for covid per [employer]

## 2020-10-14 ENCOUNTER — Telehealth (INDEPENDENT_AMBULATORY_CARE_PROVIDER_SITE_OTHER): Payer: 59 | Admitting: Nurse Practitioner

## 2020-10-14 ENCOUNTER — Encounter: Payer: Self-pay | Admitting: Nurse Practitioner

## 2020-10-14 DIAGNOSIS — U071 COVID-19: Secondary | ICD-10-CM | POA: Diagnosis not present

## 2020-10-14 MED ORDER — HYDROCOD POLST-CPM POLST ER 10-8 MG/5ML PO SUER: 5.0000 mL | Freq: Two times a day (BID) | ORAL | 0 refills | Status: DC | PRN

## 2020-10-14 MED ORDER — PREDNISONE 10 MG PO TABS: 10.0000 mg | ORAL_TABLET | Freq: Every day | ORAL | 0 refills | Status: DC

## 2020-10-14 MED ORDER — BENZONATATE 200 MG PO CAPS: 200.0000 mg | ORAL_CAPSULE | Freq: Two times a day (BID) | ORAL | 0 refills | Status: DC | PRN

## 2020-10-14 NOTE — Progress Notes (Signed)
There were no vitals taken for this visit.   Subjective:    [name], female    DOB: 02/14/1971, 50 y.o.   MRN: [id number]  HPI: Heather Costa is a 51 y.o. female  Chief Complaint  Patient presents with   Covid Positive    Pt states she first tested positive on 10/08/20. States she has been having congestion, fatigue, headache, cough, and fever.    UPPER RESPIRATORY TRACT INFECTION Worst symptom: fatigue Fever: yes Cough: yes Shortness of breath: no Wheezing: no Chest pain: yes, with cough Chest tightness: no Chest congestion: yes Nasal congestion: yes Runny nose:  not as much anymore Post nasal drip: yes Sneezing: no Sore throat: no Swollen glands: no Sinus pressure: yes Headache: yes Face pain: no Toothache: no Ear pain: no bilateral Ear pressure: yes "right Eyes red/itching: eyes are itching Eye drainage/crusting: no  Vomiting: no Rash: no Fatigue: yes Sick contacts: yes Strep contacts: no  Context: stable Recurrent sinusitis: no Relief with OTC cold/cough medications: no  Treatments attempted: none    Relevant past medical, surgical, family and social history reviewed and updated as indicated. Interim medical history since our last visit reviewed. Allergies and medications reviewed and updated.  Review of Systems  Constitutional:  Positive for fever. Negative for fatigue.  HENT:  Positive for congestion and sinus pressure. Negative for dental problem, ear pain, postnasal drip, rhinorrhea, sinus pain, sneezing and sore throat.   Respiratory:  Positive for cough. Negative for shortness of breath and wheezing.   Cardiovascular:  Negative for chest pain.  Gastrointestinal:  Negative for vomiting.  Skin:  Negative for rash.  Neurological:  Positive for headaches.   Per HPI unless specifically indicated above     Objective:    There were no vitals taken for this visit.  Wt Readings from Last 3 Encounters:  08/07/20 125 lb (56.7  kg)  09/30/19 123 lb (55.8 kg)  08/26/19 125 lb 8 oz (56.9 kg)    Physical Exam Vitals and nursing note reviewed.  HENT:     Head: Normocephalic.     Right Ear: Hearing normal.     Left Ear: Hearing normal.     Nose: Nose normal.  Eyes:     Pupils: Pupils are equal, round, and reactive to light.  Pulmonary:     Effort: Pulmonary effort is normal. No respiratory distress.  Neurological:     Mental Status: She is alert.  Psychiatric:        Mood and Affect: Mood normal.        Behavior: Behavior normal.        Thought Content: Thought content normal.        Judgment: Judgment normal.    Results for orders placed or performed in visit on 05/01/20  Novel Coronavirus, NAA ([facility])   Specimen: Nasopharyngeal(NP) swabs in vial transport medium  Result Value Ref Range   SARS-CoV-2, NAA Not Detected Not Detected      Assessment & Plan:   Problem List Items Addressed This Visit   None Visit Diagnoses     COVID-19    -  Primary   Patient is about 10 days into COVID infection. Given prednisone and cough medicine to help with sypmptoms. Reviewed s/s of when to seek higher level of care.   Relevant Medications   predniSONE (DELTASONE) 10 MG tablet   benzonatate (TESSALON) 200 MG capsule   chlorpheniramine-HYDROcodone (TUSSIONEX PENNKINETIC ER) 10-8 MG/5ML SUER  Follow up plan: Return if symptoms worsen or fail to improve.   This visit was completed via MyChart due to the restrictions of the COVID-19 pandemic. All issues as above were discussed and addressed. Physical exam was done as above through visual confirmation on MyChart. If it was felt that the patient should be evaluated in the office, they were directed there. The patient verbally consented to this visit. Location of the patient: Home Location of the provider: Office Those involved with this call:  Provider: Larae Grooms, NP CMA: Wilhemena Durie, CMA Front Desk/Registration: Harriet Pho Time spent  on call: 15 minutes with patient face to face via video conference. More than 50% of this time was spent in counseling and coordination of care. 20 minutes total spent in review of patient's record and preparation of their chart.

## 2020-10-15 ENCOUNTER — Encounter: Payer: Self-pay | Admitting: Nurse Practitioner

## 2020-10-22 ENCOUNTER — Telehealth: Payer: 59 | Admitting: Nurse Practitioner

## 2020-10-22 ENCOUNTER — Encounter: Payer: Self-pay | Admitting: Nurse Practitioner

## 2020-10-22 ENCOUNTER — Ambulatory Visit: Payer: 59 | Admitting: Nurse Practitioner

## 2020-10-22 ENCOUNTER — Other Ambulatory Visit: Payer: Self-pay

## 2020-10-22 VITALS — BP 118/76 | HR 80 | Temp 98.4°F | Wt 125.0 lb

## 2020-10-22 DIAGNOSIS — F419 Anxiety disorder, unspecified: Secondary | ICD-10-CM

## 2020-10-22 DIAGNOSIS — J0100 Acute maxillary sinusitis, unspecified: Secondary | ICD-10-CM

## 2020-10-22 DIAGNOSIS — Z1231 Encounter for screening mammogram for malignant neoplasm of breast: Secondary | ICD-10-CM

## 2020-10-22 DIAGNOSIS — F330 Major depressive disorder, recurrent, mild: Secondary | ICD-10-CM | POA: Diagnosis not present

## 2020-10-22 MED ORDER — AMOXICILLIN-POT CLAVULANATE 875-125 MG PO TABS: 1.0000 | ORAL_TABLET | Freq: Two times a day (BID) | ORAL | 0 refills | Status: AC

## 2020-10-22 MED ORDER — BUSPIRONE HCL 5 MG PO TABS: 5.0000 mg | ORAL_TABLET | Freq: Three times a day (TID) | ORAL | 0 refills | Status: DC | PRN

## 2020-10-22 NOTE — Progress Notes (Signed)
BP 118/76   Pulse 80   Temp 98.4 F (36.9 C)   Wt 125 lb (56.7 kg)   SpO2 97%   BMI 25.25 kg/m    Subjective:    [name], female    DOB: 02-Aug-1970, 50 y.o.   MRN: [id number]  HPI: Heather Costa is a 50 y.o. female  Chief Complaint  Patient presents with   URI    X 5 days, had COVID 10/08/20   Depression   Anxiety   Fibromyalgia   UPPER RESPIRATORY TRACT INFECTION Worst symptom: neck pain and constant pain in the back of her head. Fever: no Cough: no Shortness of breath: no Wheezing: no Chest pain: no Chest tightness: no Chest congestion: no Nasal congestion: yes Runny nose: no Post nasal drip: yes Sneezing: no Sore throat: no Swollen glands: yes Sinus pressure: yes Headache: no Face pain: yes Toothache: yes Ear pain: no bilateral Ear pressure: yes bilateral Eyes red/itching:no Eye drainage/crusting: yes  Vomiting: no Rash: no Fatigue: yes Sick contacts: no Strep contacts: no  Context: worse Recurrent sinusitis: no Relief with OTC cold/cough medications:  yes   Treatments attempted:  tylenol/ibuprofen and cold/sinus   DEPRESSION/ANXIETY Patient states she is okay with her depression where it is right now.  She is aware that her anxiety is heightened.  She has had some stress in her life with work.  Patient is taking Buspar PRN.  GAD 7 : Generalized Anxiety Score 10/22/2020 08/26/2019 03/14/2018 05/19/2017  Nervous, Anxious, on Edge 2 1 3 3   Control/stop worrying 2 3 3 3   Worry too much - different things 2 3 3 3   Trouble relaxing 2 3 3 3   Restless 2 3 3 3   Easily annoyed or irritable 2 3 3 3   Afraid - awful might happen 2 3 2 2   Total GAD 7 Score 14 19 20 20   Anxiety Difficulty Somewhat difficult Somewhat difficult Very difficult -    Flowsheet Row Office Visit from 10/22/2020 in [facility]  PHQ-9 Total Score 8       Relevant past medical, surgical, family and social history reviewed and updated as  indicated. Interim medical history since our last visit reviewed. Allergies and medications reviewed and updated.  Review of Systems  Constitutional:  Positive for fatigue. Negative for fever.  HENT:  Positive for congestion and sinus pressure. Negative for dental problem, ear pain, postnasal drip, rhinorrhea, sinus pain, sneezing and sore throat.   Respiratory:  Negative for cough, shortness of breath and wheezing.   Cardiovascular:  Negative for chest pain.  Gastrointestinal:  Negative for vomiting.  Skin:  Negative for rash.  Neurological:  Negative for headaches.  Psychiatric/Behavioral:  Positive for dysphoric mood. Negative for suicidal ideas. The patient is nervous/anxious.    Per HPI unless specifically indicated above     Objective:    BP 118/76   Pulse 80   Temp 98.4 F (36.9 C)   Wt 125 lb (56.7 kg)   SpO2 97%   BMI 25.25 kg/m   Wt Readings from Last 3 Encounters:  10/22/20 125 lb (56.7 kg)  08/07/20 125 lb (56.7 kg)  09/30/19 123 lb (55.8 kg)    Physical Exam Vitals and nursing note reviewed.  Constitutional:      General: She is not in acute distress.    Appearance: Normal appearance. She is normal weight. She is not ill-appearing, toxic-appearing or diaphoretic.  HENT:     Head: Normocephalic.  Salivary Glands: Right salivary gland is diffusely enlarged. Left salivary gland is diffusely enlarged.     Right Ear: Tympanic membrane, ear canal and external ear normal. There is no impacted cerumen.     Left Ear: Tympanic membrane, ear canal and external ear normal. There is no impacted cerumen.     Nose: Congestion present.     Right Sinus: Maxillary sinus tenderness present. No frontal sinus tenderness.     Left Sinus: Maxillary sinus tenderness present. No frontal sinus tenderness.     Mouth/Throat:     Mouth: Mucous membranes are moist.     Pharynx: Oropharynx is clear.  Eyes:     General:        Right eye: No discharge.        Left eye: No discharge.      Extraocular Movements: Extraocular movements intact.     Conjunctiva/sclera: Conjunctivae normal.     Pupils: Pupils are equal, round, and reactive to light.  Cardiovascular:     Rate and Rhythm: Normal rate and regular rhythm.     Heart sounds: No murmur heard. Pulmonary:     Effort: Pulmonary effort is normal. No respiratory distress.     Breath sounds: Normal breath sounds. No wheezing or rales.  Musculoskeletal:     Cervical back: Normal range of motion and neck supple.  Skin:    General: Skin is warm and dry.     Capillary Refill: Capillary refill takes less than 2 seconds.  Neurological:     General: No focal deficit present.     Mental Status: She is alert and oriented to person, place, and time. Mental status is at baseline.  Psychiatric:        Mood and Affect: Mood normal.        Behavior: Behavior normal.        Thought Content: Thought content normal.        Judgment: Judgment normal.    Results for orders placed or performed in visit on 05/01/20  Novel Coronavirus, NAA ([facility])   Specimen: Nasopharyngeal(NP) swabs in vial transport medium  Result Value Ref Range   SARS-CoV-2, NAA Not Detected Not Detected      Assessment & Plan:   Problem List Items Addressed This Visit       Other   Depression    Chronic.  Patient okay with her mood despite her PHQ9. Denies needing medication at this time.  Follow up in 1 month for reevaluation. Denies SI.       Anxiety    Chronic. Exacerbated. Discussed with patient that it would benefit her to take Buspar TID versus PRN for anxiety relief. She agrees that it would benefit her to take it regularly during this stressful time in her life.  Follow up in 1 month for reevaluation.       Other Visit Diagnoses     Acute non-recurrent maxillary sinusitis    -  Primary   Augmentin given to patient to treat sinus infection. Return to clinic if symptoms worsen or fail to improve.   Relevant Medications    amoxicillin-clavulanate (AUGMENTIN) 875-125 MG tablet   Encounter for screening mammogram for malignant neoplasm of breast       Relevant Orders   MM DIGITAL SCREENING BILATERAL        Follow up plan: Return in about 1 month (around 11/22/2020) for Physical and Fasting labs.

## 2020-10-22 NOTE — Assessment & Plan Note (Signed)
Chronic. Exacerbated. Discussed with patient that it would benefit her to take Buspar TID versus PRN for anxiety relief. She agrees that it would benefit her to take it regularly during this stressful time in her life.  Follow up in 1 month for reevaluation.

## 2020-10-22 NOTE — Assessment & Plan Note (Signed)
Chronic.  Patient okay with her mood despite her PHQ9. Denies needing medication at this time.  Follow up in 1 month for reevaluation. Denies SI.

## 2020-12-08 NOTE — Progress Notes (Signed)
BP 125/86   Pulse 62   Temp 98.5 F (36.9 C) (Oral)   Ht 4' 9.99" (1.473 m)   Wt 118 lb 6.4 oz (53.7 kg)   SpO2 99%   BMI 24.75 kg/m    Subjective:    [name], female    DOB: January 29, 1971, 50 y.o.   MRN: [id number]  HPI: Heather Costa is a 50 y.o. female presenting on 12/09/2020 for comprehensive medical examination. Current medical complaints include:none  She currently lives with: Menopausal Symptoms: no  ANXIETY/DEPRESSION Patient states she has been under a lot of stress recently. She feels like her anxiety is well controlled. Denies SI.  FIBROMYALGIA Having trouble sleeping at night.  She has not been taking any medication.  States she has been able to deal with the pain recently.  Denies other concerns at visit today.    Denies HA, CP, SOB, dizziness, palpitations, visual changes, and lower extremity swelling.  Depression Screen done today and results listed below:  Depression screen [facility] 2/9 12/09/2020 10/22/2020 08/26/2019 03/14/2018 08/17/2017  Decreased Interest 0 1 0 3 0  Down, Depressed, Hopeless 0 0 0 3 1  PHQ - 2 Score 0 1 0 6 1  Altered sleeping 3 3 3 3 1   Tired, decreased energy 0 2 3 3 1   Change in appetite 0 0 0 3 1  Feeling bad or failure about yourself  0 0 0 2 0  Trouble concentrating 1 2 0 2 0  Moving slowly or fidgety/restless 0 0 0 2 1  Suicidal thoughts 0 0 0 0 0  PHQ-9 Score 4 8 6 21 5   Difficult doing work/chores Not difficult at all Not difficult at all Not difficult at all Somewhat difficult -    The patient does not have a history of falls. I did complete a risk assessment for falls. A plan of care for falls was documented.   Past Medical History:  Past Medical History:  Diagnosis Date   Allergy    Depression    Depression with anxiety    Family history of adverse reaction to anesthesia    Father - slow to wake   Fibromyalgia    GERD (gastroesophageal reflux disease)    Migraine headache    1-2x/mo    Neuromuscular disorder [facility])     Surgical History:  Past Surgical History:  Procedure Laterality Date   COLONOSCOPY WITH PROPOFOL N/A 09/30/2019   Procedure: COLONOSCOPY WITH PROPOFOL, POLYPECTOMY;  Surgeon: , MD;  Location: [facility];  Service: Endoscopy;  Laterality: N/A;   Dysplasia Procedures     TUBAL LIGATION      Medications:  Current Outpatient Medications on File Prior to Visit  Medication Sig   busPIRone (BUSPAR) 5 MG tablet Take 1 tablet (5 mg total) by mouth 3 (three) times daily as needed.   No current facility-administered medications on file prior to visit.    Allergies:  Allergies  Allergen Reactions   Sulfa Antibiotics Anaphylaxis    Social History:  Social History   Socioeconomic History   Marital status: Married    Spouse name: Not on file   Number of children: Not on file   Years of education: Not on file   Highest education level: Not on file  Occupational History   Not on file  Tobacco Use   Smoking status: Every Day    Packs/day: 0.50    Years: 30.00    Pack years: 15.00  Types: Cigarettes   Smokeless tobacco: Never   Tobacco comments:    since age 5  Vaping Use   Vaping Use: Never used  Substance and Sexual Activity   Alcohol use: Not Currently    Comment: on occasion   Drug use: No   Sexual activity: Not Currently  Other Topics Concern   Not on file  Social History Narrative   Not on file   Social Determinants of Health   Financial Resource Strain: Not on file  Food Insecurity: Not on file  Transportation Needs: Not on file  Physical Activity: Not on file  Stress: Not on file  Social Connections: Not on file  Intimate Partner Violence: Not on file   Social History   Tobacco Use  Smoking Status Every Day   Packs/day: 0.50   Years: 30.00   Pack years: 15.00   Types: Cigarettes  Smokeless Tobacco Never  Tobacco Comments   since age 42   Social History   Substance and Sexual Activity   Alcohol Use Not Currently   Comment: on occasion    Family History:  Family History  Problem Relation Age of Onset   Healthy Mother    Healthy Father    Leukemia Paternal Uncle    Emphysema Paternal Grandmother    Emphysema Paternal Grandfather     Past medical history, surgical history, medications, allergies, family history and social history reviewed with patient today and changes made to appropriate areas of the chart.   Review of Systems  Eyes:  Negative for blurred vision and double vision.  Respiratory:  Negative for shortness of breath.   Cardiovascular:  Negative for chest pain, palpitations and leg swelling.  Neurological:  Negative for dizziness and headaches.  Psychiatric/Behavioral:  Negative for depression and suicidal ideas. The patient is not nervous/anxious.   All other ROS negative except what is listed above and in the HPI.      Objective:    BP 125/86   Pulse 62   Temp 98.5 F (36.9 C) (Oral)   Ht 4' 9.99" (1.473 m)   Wt 118 lb 6.4 oz (53.7 kg)   SpO2 99%   BMI 24.75 kg/m   Wt Readings from Last 3 Encounters:  12/09/20 118 lb 6.4 oz (53.7 kg)  10/22/20 125 lb (56.7 kg)  08/07/20 125 lb (56.7 kg)    Physical Exam Vitals and nursing note reviewed. Exam conducted with a chaperone present [name], CMA.).  Constitutional:      General: She is awake. She is not in acute distress.    Appearance: She is well-developed. She is not ill-appearing.  HENT:     Head: Normocephalic and atraumatic.     Right Ear: Hearing, tympanic membrane, ear canal and external ear normal. No drainage.     Left Ear: Hearing, tympanic membrane, ear canal and external ear normal. No drainage.     Nose: Nose normal.     Right Sinus: No maxillary sinus tenderness or frontal sinus tenderness.     Left Sinus: No maxillary sinus tenderness or frontal sinus tenderness.     Mouth/Throat:     Mouth: Mucous membranes are moist.     Pharynx: Oropharynx is clear. Uvula  midline. No pharyngeal swelling, oropharyngeal exudate or posterior oropharyngeal erythema.  Eyes:     General: Lids are normal.        Right eye: No discharge.        Left eye: No discharge.     Extraocular  Movements: Extraocular movements intact.     Conjunctiva/sclera: Conjunctivae normal.     Pupils: Pupils are equal, round, and reactive to light.     Visual Fields: Right eye visual fields normal and left eye visual fields normal.  Neck:     Thyroid: No thyromegaly.     Vascular: No carotid bruit.     Trachea: Trachea normal.  Cardiovascular:     Rate and Rhythm: Normal rate and regular rhythm.     Heart sounds: Normal heart sounds. No murmur heard.   No gallop.  Pulmonary:     Effort: Pulmonary effort is normal. No accessory muscle usage or respiratory distress.     Breath sounds: Normal breath sounds.  Chest:  Breasts:    Right: Normal.     Left: Normal.  Abdominal:     General: Bowel sounds are normal.     Palpations: Abdomen is soft. There is no hepatomegaly or splenomegaly.     Tenderness: There is no abdominal tenderness.  Genitourinary:    Vagina: Normal.     Cervix: Normal.     Adnexa: Right adnexa normal and left adnexa normal.  Musculoskeletal:        General: Normal range of motion.     Cervical back: Normal range of motion and neck supple.     Right lower leg: No edema.     Left lower leg: No edema.  Lymphadenopathy:     Head:     Right side of head: No submental, submandibular, tonsillar, preauricular or posterior auricular adenopathy.     Left side of head: No submental, submandibular, tonsillar, preauricular or posterior auricular adenopathy.     Cervical: No cervical adenopathy.     Upper Body:     Right upper body: No supraclavicular, axillary or pectoral adenopathy.     Left upper body: No supraclavicular, axillary or pectoral adenopathy.  Skin:    General: Skin is warm and dry.     Capillary Refill: Capillary refill takes less than 2 seconds.      Findings: No rash.  Neurological:     Mental Status: She is alert and oriented to person, place, and time.     Cranial Nerves: Cranial nerves are intact.     Gait: Gait is intact.     Deep Tendon Reflexes: Reflexes are normal and symmetric.     Reflex Scores:      Brachioradialis reflexes are 2+ on the right side and 2+ on the left side.      Patellar reflexes are 2+ on the right side and 2+ on the left side. Psychiatric:        Attention and Perception: Attention normal.        Mood and Affect: Mood normal.        Speech: Speech normal.        Behavior: Behavior normal. Behavior is cooperative.        Thought Content: Thought content normal.        Judgment: Judgment normal.    Results for orders placed or performed in visit on 05/01/20  Novel Coronavirus, NAA ([facility])   Specimen: Nasopharyngeal(NP) swabs in vial transport medium  Result Value Ref Range   SARS-CoV-2, NAA Not Detected Not Detected      Assessment & Plan:   Problem List Items Addressed This Visit       Other   Depression    Chronic.  Controlled.  Continue with current medication regimen.  Labs ordered today.  Refills sent today.  Return to clinic in 6 months for reevaluation.  Call sooner if concerns arise.        Relevant Medications   hydrOXYzine (ATARAX/VISTARIL) 25 MG tablet   Fibromyalgia    Chronic.  Controlled.  Continue with current medication regimen.  Labs ordered today.  Refills sent today.  Return to clinic in 6 months for reevaluation.  Call sooner if concerns arise.       Relevant Medications   gabapentin (NEURONTIN) 300 MG capsule   methocarbamol (ROBAXIN) 500 MG tablet   Anxiety    Chronic.  Controlled.  Continue with current medication regimen.  Labs ordered today.  Refills sent today.  Return to clinic in 6 months for reevaluation.  Call sooner if concerns arise.       Relevant Medications   hydrOXYzine (ATARAX/VISTARIL) 25 MG tablet   Other Visit Diagnoses     Annual physical  exam    -  Primary   Health mainteneance reviewed today.  Labs ordered. Will bring pneumonia vaccine records to next visit.    Relevant Orders   CBC with Differential/Platelet   Comprehensive metabolic panel   Lipid panel   TSH   Urinalysis, Routine w reflex microscopic   Cytology - PAP   Screening for cervical cancer       PAP obtained in normal fashion. Patient tolerated obtaining of PAP specimen without complication. Excorted by Lauris Chromanammy Daveley, CMA.   Encounter for hepatitis C screening test for low risk patient       Relevant Orders   Hepatitis C Antibody        Follow up plan: Return in about 6 months (around 06/11/2021) for Depression/Anxiety FU.   LABORATORY TESTING:  - Pap smear: up to date  IMMUNIZATIONS:   - Tdap: Tetanus vaccination status reviewed: last tetanus booster within 10 years. - Influenza: Postponed to flu season - [place]: [place] - Prevnar: Not applicable - HPV: Not applicable - Zostavax vaccine:  Will get at the pharmacy.  SCREENING: -Mammogram:  Plans to call and make appointment   - Colonoscopy: Up to date  - Bone Density: Not applicable  -Hearing Test: Not applicable  -Spirometry: Not applicable   PATIENT COUNSELING:   Advised to take 1 mg of folate supplement per day if capable of pregnancy.   Sexuality: Discussed sexually transmitted diseases, partner selection, use of condoms, avoidance of unintended pregnancy  and contraceptive alternatives.   Advised to avoid cigarette smoking.  I discussed with the patient that most people either abstain from alcohol or drink within safe limits (<=14/week and <=4 drinks/occasion for males, <=7/weeks and <= 3 drinks/occasion for females) and that the risk for alcohol disorders and other health effects rises proportionally with the number of drinks per week and how often a drinker exceeds daily limits.  Discussed cessation/primary prevention of drug use and availability of treatment for  abuse.   Diet: Encouraged to adjust caloric intake to maintain  or achieve ideal body weight, to reduce intake of dietary saturated fat and total fat, to limit sodium intake by avoiding high sodium foods and not adding table salt, and to maintain adequate dietary potassium and calcium preferably from fresh fruits, vegetables, and low-fat dairy products.    stressed the importance of regular exercise  Injury prevention: Discussed safety belts, safety helmets, smoke detector, smoking near bedding or upholstery.   Dental health: Discussed importance of regular tooth brushing, flossing, and dental visits.    NEXT  PREVENTATIVE PHYSICAL DUE IN 1 YEAR. Return in about 6 months (around 06/11/2021) for Depression/Anxiety FU.

## 2020-12-09 ENCOUNTER — Ambulatory Visit (INDEPENDENT_AMBULATORY_CARE_PROVIDER_SITE_OTHER): Payer: 59 | Admitting: Nurse Practitioner

## 2020-12-09 ENCOUNTER — Other Ambulatory Visit: Payer: Self-pay

## 2020-12-09 ENCOUNTER — Encounter: Payer: Self-pay | Admitting: Nurse Practitioner

## 2020-12-09 ENCOUNTER — Other Ambulatory Visit (HOSPITAL_COMMUNITY)
Admission: RE | Admit: 2020-12-09 | Discharge: 2020-12-09 | Disposition: A | Discharge status: Home / Self Care | Payer: 59 | Source: Ambulatory Visit | Attending: Nurse Practitioner | Admitting: Nurse Practitioner

## 2020-12-09 VITALS — BP 125/86 | HR 62 | Temp 98.5°F | Ht <= 58 in | Wt 118.4 lb

## 2020-12-09 DIAGNOSIS — Z124 Encounter for screening for malignant neoplasm of cervix: Secondary | ICD-10-CM | POA: Diagnosis not present

## 2020-12-09 DIAGNOSIS — F419 Anxiety disorder, unspecified: Secondary | ICD-10-CM | POA: Diagnosis not present

## 2020-12-09 DIAGNOSIS — Z0000 Encounter for general adult medical examination without abnormal findings: Secondary | ICD-10-CM | POA: Diagnosis not present

## 2020-12-09 DIAGNOSIS — Z1159 Encounter for screening for other viral diseases: Secondary | ICD-10-CM

## 2020-12-09 DIAGNOSIS — F330 Major depressive disorder, recurrent, mild: Secondary | ICD-10-CM | POA: Diagnosis not present

## 2020-12-09 DIAGNOSIS — M797 Fibromyalgia: Secondary | ICD-10-CM

## 2020-12-09 LAB — URINALYSIS, ROUTINE W REFLEX MICROSCOPIC
Bilirubin, UA: NEGATIVE
Glucose, UA: NEGATIVE
Leukocytes,UA: NEGATIVE
Nitrite, UA: NEGATIVE
Protein,UA: NEGATIVE
Specific Gravity, UA: 1.025 (ref 1.005–1.030)
Urobilinogen, Ur: 0.2 mg/dL (ref 0.2–1.0)
pH, UA: 5 (ref 5.0–7.5)

## 2020-12-09 LAB — MICROSCOPIC EXAMINATION
Bacteria, UA: NONE SEEN
Epithelial Cells (non renal): NONE SEEN /hpf (ref 0–10)
WBC, UA: NONE SEEN /hpf (ref 0–5)

## 2020-12-09 MED ORDER — SUMATRIPTAN SUCCINATE 50 MG PO TABS: ORAL_TABLET | ORAL | 1 refills | Status: DC

## 2020-12-09 MED ORDER — GABAPENTIN 300 MG PO CAPS: 300.0000 mg | ORAL_CAPSULE | Freq: Three times a day (TID) | ORAL | 1 refills | Status: DC

## 2020-12-09 MED ORDER — METHOCARBAMOL 500 MG PO TABS: 500.0000 mg | ORAL_TABLET | Freq: Three times a day (TID) | ORAL | 1 refills | Status: DC

## 2020-12-09 MED ORDER — HYDROXYZINE HCL 25 MG PO TABS: 25.0000 mg | ORAL_TABLET | Freq: Three times a day (TID) | ORAL | 1 refills | Status: DC | PRN

## 2020-12-09 NOTE — Assessment & Plan Note (Signed)
Chronic.  Controlled.  Continue with current medication regimen.  Labs ordered today.  Refills sent today.  Return to clinic in 6 months for reevaluation.  Call sooner if concerns arise.   

## 2020-12-10 LAB — COMPREHENSIVE METABOLIC PANEL
ALT: 7 IU/L (ref 0–32)
AST: 15 IU/L (ref 0–40)
Albumin/Globulin Ratio: 2.3 — ABNORMAL HIGH (ref 1.2–2.2)
Albumin: 4.9 g/dL (ref 3.8–4.9)
Alkaline Phosphatase: 49 IU/L (ref 44–121)
BUN/Creatinine Ratio: 28 — ABNORMAL HIGH (ref 9–23)
BUN: 17 mg/dL (ref 6–24)
Bilirubin Total: 0.2 mg/dL (ref 0.0–1.2)
CO2: 23 mmol/L (ref 20–29)
Calcium: 9.5 mg/dL (ref 8.7–10.2)
Chloride: 102 mmol/L (ref 96–106)
Creatinine, Ser: 0.61 mg/dL (ref 0.57–1.00)
Globulin, Total: 2.1 g/dL (ref 1.5–4.5)
Glucose: 89 mg/dL (ref 65–99)
Potassium: 4.4 mmol/L (ref 3.5–5.2)
Sodium: 139 mmol/L (ref 134–144)
Total Protein: 7 g/dL (ref 6.0–8.5)
eGFR: 108 mL/min/{1.73_m2} (ref >59)

## 2020-12-10 LAB — CBC WITH DIFFERENTIAL/PLATELET
Basophils Absolute: 0 10*3/uL (ref 0.0–0.2)
Basos: 0 %
EOS (ABSOLUTE): 0.1 10*3/uL (ref 0.0–0.4)
Eos: 1 %
Hematocrit: 43.9 % (ref 34.0–46.6)
Hemoglobin: 14.6 g/dL (ref 11.1–15.9)
Immature Grans (Abs): 0 10*3/uL (ref 0.0–0.1)
Immature Granulocytes: 0 %
Lymphocytes Absolute: 2 10*3/uL (ref 0.7–3.1)
Lymphs: 20 %
MCH: 33 pg (ref 26.6–33.0)
MCHC: 33.3 g/dL (ref 31.5–35.7)
MCV: 99 fL — ABNORMAL HIGH (ref 79–97)
Monocytes Absolute: 0.5 10*3/uL (ref 0.1–0.9)
Monocytes: 5 %
Neutrophils Absolute: 7.7 10*3/uL — ABNORMAL HIGH (ref 1.4–7.0)
Neutrophils: 74 %
Platelets: 291 10*3/uL (ref 150–450)
RBC: 4.42 x10E6/uL (ref 3.77–5.28)
RDW: 12.6 % (ref 11.7–15.4)
WBC: 10.2 10*3/uL (ref 3.4–10.8)

## 2020-12-10 LAB — LIPID PANEL
Chol/HDL Ratio: 2.4 ratio (ref 0.0–4.4)
Cholesterol, Total: 225 mg/dL — ABNORMAL HIGH (ref 100–199)
HDL: 95 mg/dL (ref >39)
LDL Chol Calc (NIH): 118 mg/dL — ABNORMAL HIGH (ref 0–99)
Triglycerides: 67 mg/dL (ref 0–149)
VLDL Cholesterol Cal: 12 mg/dL (ref 5–40)

## 2020-12-10 LAB — TSH: TSH: 1.03 u[IU]/mL (ref 0.450–4.500)

## 2020-12-10 LAB — HEPATITIS C ANTIBODY: Hep C Virus Ab: <0.1 s/co ratio (ref 0.0–0.9)

## 2020-12-10 NOTE — Progress Notes (Signed)
Hi Heather Costa.  Overall your lab work looks good. I recommend increasing your water intake as it looks like you were a little dehydrated yesterday. Your cholesterol is slightly elevated.  Recommend a low fat diet and exercise.  We will recheck at your next visit. Otherwise lab work looks good. Ill send you another message once your PAP results come back.

## 2020-12-14 LAB — CYTOLOGY - PAP
Comment: NEGATIVE
Diagnosis: NEGATIVE
High risk HPV: NEGATIVE

## 2020-12-14 NOTE — Progress Notes (Signed)
Hi Pragya.  Your PAP came back and it was normal.  We will repeat it in 5 years.  Please let me know if you have any questions.

## 2021-03-09 ENCOUNTER — Encounter: Payer: Self-pay | Admitting: Nurse Practitioner

## 2021-03-09 ENCOUNTER — Other Ambulatory Visit: Payer: Self-pay | Admitting: Nurse Practitioner

## 2021-03-09 MED ORDER — BUSPIRONE HCL 5 MG PO TABS: 5.0000 mg | ORAL_TABLET | Freq: Three times a day (TID) | ORAL | 0 refills | Status: DC | PRN

## 2021-03-10 MED ORDER — OMEPRAZOLE 40 MG PO CPDR: 40.0000 mg | DELAYED_RELEASE_CAPSULE | Freq: Every day | ORAL | 1 refills | Status: DC

## 2021-04-20 NOTE — Progress Notes (Signed)
BP (!) 111/59    Pulse 74    Temp 98.2 F (36.8 C) (Oral)    Wt 119 lb (54 kg)    SpO2 98%    BMI 24.88 kg/m    Subjective:    [name], female    DOB: 28-Jul-1970, 51 y.o.   MRN: [id number]  HPI: Heather Costa is a 51 y.o. female  Chief Complaint  Patient presents with   Insomnia   Menopause   URI    Pt states she has been having sinus pressure, cough, congestion, and body aches for the last 3 days.    UPPER RESPIRATORY TRACT INFECTION Worst symptom: started 3 days ago Fever: no Cough: yes Shortness of breath:  a little bit every now and then Wheezing: no Chest pain:  occasionally Chest tightness:  occasionally Chest congestion: no Nasal congestion: yes Runny nose: yes Post nasal drip: yes Sneezing: yes Sore throat: no Swollen glands: yes Sinus pressure: yes Headache: yes Face pain: yes Toothache: yes Ear pain: yes bilateral Ear pressure: yes bilateral Eyes red/itching:yes Eye drainage/crusting: yes  Vomiting: no Rash: no Fatigue: yes Sick contacts: no Strep contacts: no  Context: worse Recurrent sinusitis: no Relief with OTC cold/cough medications: no  Treatments attempted: none   SLEEP DISTURBANCE Patient states for the last couple of months she will fall asleep at 8-9pm then up from 11-2. She has been having very erratic sleep this has started. She hasn't had a period in 2 months.  Her mood is affected by sleep.  Patient states she has been taking black cohosh and prim rose and last night she was able to sleep until 530 this morning.   FIBROMYALGIA Patient states she takes the Gabapentin PRN because it makes her not feel right.  Even taking the gabapentin at bedtime does not help her sleep and she wakes up very groggy due to taking the medication.    Relevant past medical, surgical, family and social history reviewed and updated as indicated. Interim medical history since our last visit reviewed. Allergies and medications reviewed  and updated.  Review of Systems  Endocrine: Positive for cold intolerance.  Psychiatric/Behavioral:  Positive for dysphoric mood and sleep disturbance. The patient is nervous/anxious.    Per HPI unless specifically indicated above     Objective:    BP (!) 111/59    Pulse 74    Temp 98.2 F (36.8 C) (Oral)    Wt 119 lb (54 kg)    SpO2 98%    BMI 24.88 kg/m   Wt Readings from Last 3 Encounters:  04/21/21 119 lb (54 kg)  12/09/20 118 lb 6.4 oz (53.7 kg)  10/22/20 125 lb (56.7 kg)    Physical Exam Vitals and nursing note reviewed.  Constitutional:      General: She is not in acute distress.    Appearance: Normal appearance. She is normal weight. She is not ill-appearing, toxic-appearing or diaphoretic.  HENT:     Head: Normocephalic.     Right Ear: Tympanic membrane and external ear normal.     Left Ear: Tympanic membrane and external ear normal.     Nose: Congestion and rhinorrhea present.     Right Sinus: Maxillary sinus tenderness and frontal sinus tenderness present.     Left Sinus: Maxillary sinus tenderness and frontal sinus tenderness present.     Mouth/Throat:     Mouth: Mucous membranes are moist.     Pharynx: Posterior oropharyngeal erythema present. No oropharyngeal exudate.  Eyes:     General:        Right eye: No discharge.        Left eye: No discharge.     Extraocular Movements: Extraocular movements intact.     Conjunctiva/sclera: Conjunctivae normal.     Pupils: Pupils are equal, round, and reactive to light.  Cardiovascular:     Rate and Rhythm: Normal rate and regular rhythm.     Heart sounds: No murmur heard. Pulmonary:     Effort: Pulmonary effort is normal. No respiratory distress.     Breath sounds: Normal breath sounds. No wheezing or rales.  Musculoskeletal:     Cervical back: Normal range of motion and neck supple.  Skin:    General: Skin is warm and dry.     Capillary Refill: Capillary refill takes less than 2 seconds.  Neurological:      General: No focal deficit present.     Mental Status: She is alert and oriented to person, place, and time. Mental status is at baseline.  Psychiatric:        Mood and Affect: Mood normal.        Behavior: Behavior normal.        Thought Content: Thought content normal.        Judgment: Judgment normal.    Results for orders placed or performed in visit on 12/09/20  Microscopic Examination   Urine  Result Value Ref Range   WBC, UA None seen 0 - 5 /hpf   RBC 0-2 0 - 2 /hpf   Epithelial Cells (non renal) None seen 0 - 10 /hpf   Bacteria, UA None seen None seen/Few  CBC with Differential/Platelet  Result Value Ref Range   WBC 10.2 3.4 - 10.8 x10E3/uL   RBC 4.42 3.77 - 5.28 x10E6/uL   Hemoglobin 14.6 11.1 - 15.9 g/dL   Hematocrit 43.9 34.0 - 46.6 %   MCV 99 (H) 79 - 97 fL   MCH 33.0 26.6 - 33.0 pg   MCHC 33.3 31.5 - 35.7 g/dL   RDW 12.6 11.7 - 15.4 %   Platelets 291 150 - 450 x10E3/uL   Neutrophils 74 Not Estab. %   Lymphs 20 Not Estab. %   Monocytes 5 Not Estab. %   Eos 1 Not Estab. %   Basos 0 Not Estab. %   Neutrophils Absolute 7.7 (H) 1.4 - 7.0 x10E3/uL   Lymphocytes Absolute 2.0 0.7 - 3.1 x10E3/uL   Monocytes Absolute 0.5 0.1 - 0.9 x10E3/uL   EOS (ABSOLUTE) 0.1 0.0 - 0.4 x10E3/uL   Basophils Absolute 0.0 0.0 - 0.2 x10E3/uL   Immature Granulocytes 0 Not Estab. %   Immature Grans (Abs) 0.0 0.0 - 0.1 x10E3/uL  Comprehensive metabolic panel  Result Value Ref Range   Glucose 89 65 - 99 mg/dL   BUN 17 6 - 24 mg/dL   Creatinine, Ser 0.61 0.57 - 1.00 mg/dL   eGFR 108 >59 mL/min/1.73   BUN/Creatinine Ratio 28 (H) 9 - 23   Sodium 139 134 - 144 mmol/L   Potassium 4.4 3.5 - 5.2 mmol/L   Chloride 102 96 - 106 mmol/L   CO2 23 20 - 29 mmol/L   Calcium 9.5 8.7 - 10.2 mg/dL   Total Protein 7.0 6.0 - 8.5 g/dL   Albumin 4.9 3.8 - 4.9 g/dL   Globulin, Total 2.1 1.5 - 4.5 g/dL   Albumin/Globulin Ratio 2.3 (H) 1.2 - 2.2   Bilirubin Total 0.2 0.0 - 1.2 mg/dL  Alkaline Phosphatase  49 44 - 121 IU/L   AST 15 0 - 40 IU/L   ALT 7 0 - 32 IU/L  Lipid panel  Result Value Ref Range   Cholesterol, Total 225 (H) 100 - 199 mg/dL   Triglycerides 67 0 - 149 mg/dL   HDL 95 >39 mg/dL   VLDL Cholesterol Cal 12 5 - 40 mg/dL   LDL Chol Calc (NIH) 118 (H) 0 - 99 mg/dL   Chol/HDL Ratio 2.4 0.0 - 4.4 ratio  TSH  Result Value Ref Range   TSH 1.030 0.450 - 4.500 uIU/mL  Urinalysis, Routine w reflex microscopic  Result Value Ref Range   Specific Gravity, UA 1.025 1.005 - 1.030   pH, UA 5.0 5.0 - 7.5   Color, UA Yellow Yellow   Appearance Ur Clear Clear   Leukocytes,UA Negative Negative   Protein,UA Negative Negative/Trace   Glucose, UA Negative Negative   Ketones, UA Trace (A) Negative   RBC, UA Trace (A) Negative   Bilirubin, UA Negative Negative   Urobilinogen, Ur 0.2 0.2 - 1.0 mg/dL   Nitrite, UA Negative Negative   Microscopic Examination See below:   Hepatitis C Antibody  Result Value Ref Range   Hep C Virus Ab <0.1 0.0 - 0.9 s/co ratio  Cytology - PAP  Result Value Ref Range   High risk HPV Negative    Adequacy      Satisfactory for evaluation; transformation zone component PRESENT.   Diagnosis      - Negative for intraepithelial lesion or malignancy (NILM)   Comment Normal Reference Range HPV - Negative       Assessment & Plan:   Problem List Items Addressed This Visit   None Visit Diagnoses     Menopause    -  Primary   Hot flashes and sleep disturbance from menopause. Continue with Black Cohosh, Prim Rose and gabapentin to help with sleep. Follow up in 1 month for reevaluation   Acute non-recurrent frontal sinusitis       Complete course of antibiotics and steroids. Neg flu. Follow up if symptoms do not improve.   Relevant Medications   amoxicillin-clavulanate (AUGMENTIN) 875-125 MG tablet   fluconazole (DIFLUCAN) 150 MG tablet   methylPREDNISolone (MEDROL DOSEPAK) 4 MG TBPK tablet   Acute cough       Relevant Orders   Novel Coronavirus, NAA  ([facility])   Veritor Flu A/B Waived        Follow up plan: Return in about 1 month (around 05/22/2021) for hot flashes.   A total of 30 minutes were spent on this encounter today.  When total time is documented, this includes both the face-to-face and non-face-to-face time personally spent before, during and after the visit on the date of the encounter reviewing symptoms and developing plan of care with patient.

## 2021-04-21 ENCOUNTER — Other Ambulatory Visit: Payer: Self-pay

## 2021-04-21 ENCOUNTER — Ambulatory Visit: Payer: 59 | Admitting: Nurse Practitioner

## 2021-04-21 ENCOUNTER — Encounter: Payer: Self-pay | Admitting: Nurse Practitioner

## 2021-04-21 VITALS — BP 111/59 | HR 74 | Temp 98.2°F | Wt 119.0 lb

## 2021-04-21 DIAGNOSIS — Z780 Asymptomatic menopausal state: Secondary | ICD-10-CM

## 2021-04-21 DIAGNOSIS — R051 Acute cough: Secondary | ICD-10-CM

## 2021-04-21 DIAGNOSIS — J0110 Acute frontal sinusitis, unspecified: Secondary | ICD-10-CM

## 2021-04-21 LAB — VERITOR FLU A/B WAIVED
Influenza A: NEGATIVE
Influenza B: NEGATIVE

## 2021-04-21 MED ORDER — FLUCONAZOLE 150 MG PO TABS: 150.0000 mg | ORAL_TABLET | Freq: Once | ORAL | 0 refills | Status: AC

## 2021-04-21 MED ORDER — METHYLPREDNISOLONE 4 MG PO TBPK: ORAL_TABLET | ORAL | 0 refills | Status: DC

## 2021-04-21 MED ORDER — DICLOFENAC SODIUM 1 % EX GEL: 4.0000 g | Freq: Four times a day (QID) | CUTANEOUS | 0 refills | Status: DC

## 2021-04-21 MED ORDER — AMOXICILLIN-POT CLAVULANATE 875-125 MG PO TABS: 1.0000 | ORAL_TABLET | Freq: Two times a day (BID) | ORAL | 0 refills | Status: AC

## 2021-04-21 NOTE — Progress Notes (Signed)
Results discussed with patient during visit.

## 2021-04-23 LAB — SARS-COV-2, NAA 2 DAY TAT

## 2021-04-23 LAB — NOVEL CORONAVIRUS, NAA: SARS-CoV-2, NAA: NOT DETECTED

## 2021-04-23 NOTE — Progress Notes (Signed)
Hi Heather Costa.  Your COVID test was negative.

## 2021-05-01 ENCOUNTER — Encounter: Payer: Self-pay | Admitting: Intensive Care

## 2021-05-01 ENCOUNTER — Emergency Department
Admission: EM | Admit: 2021-05-01 | Discharge: 2021-05-01 | Disposition: A | Discharge status: Home / Self Care | Payer: 59 | Attending: Emergency Medicine | Admitting: Emergency Medicine

## 2021-05-01 ENCOUNTER — Other Ambulatory Visit: Payer: Self-pay

## 2021-05-01 DIAGNOSIS — W540XXA Bitten by dog, initial encounter: Secondary | ICD-10-CM | POA: Insufficient documentation

## 2021-05-01 DIAGNOSIS — T148XXA Other injury of unspecified body region, initial encounter: Secondary | ICD-10-CM | POA: Insufficient documentation

## 2021-05-01 HISTORY — DX: Anxiety disorder, unspecified: F41.9

## 2021-05-01 MED ORDER — AMOXICILLIN-POT CLAVULANATE 875-125 MG PO TABS: 1.0000 | ORAL_TABLET | Freq: Two times a day (BID) | ORAL | 0 refills | Status: AC

## 2021-05-01 NOTE — ED Triage Notes (Signed)
First Nurse Note:  Arrives with C/O dog bite to right side this morning.  States Personal assistant notified.

## 2021-05-01 NOTE — ED Provider Notes (Signed)
[facility] Provider Note    Event Date/Time   First MD Initiated Contact with Patient 05/01/21 [phone]     (approximate)   History   Animal Bite   HPI  Heather Costa is a 51 y.o. female who comes in reporting dog bite by the neighbors dog this morning.  Dog because that shots up-to-date.  We will control his regular food.  Animal control is quarantining the dog.      Physical Exam   Triage Vital Signs: ED Triage Vitals  Enc Vitals Group     BP 05/01/21 0846 (!) 158/84     Pulse Rate 05/01/21 0846 64     Resp 05/01/21 0846 14     Temp 05/01/21 0846 (!) 97.5 F (36.4 C)     Temp Source 05/01/21 0846 Oral     SpO2 05/01/21 0846 97 %     Weight 05/01/21 0845 119 lb (54 kg)     Height 05/01/21 0845 4\' 11"  (1.499 m)     Head Circumference --      Peak Flow --      Pain Score 05/01/21 0845 8     Pain Loc --      Pain Edu? --      Excl. in GC? --     Most recent vital signs: Vitals:   05/01/21 0846 05/01/21 1007  BP: (!) 158/84 (!) 144/83  Pulse: 64 66  Resp: 14 16  Temp: (!) 97.5 F (36.4 C)   SpO2: 97% 97%     General: Awake, no distress.  CV:  Good peripheral perfusion.  Resp:  Normal effort.  Abd:  No distention.  Other:  Again an area dog bite shows abrasion through the skin by the dogs incisors and canines.  There is no puncture although the fat is exposed by the one canine abrasion.  It is about 1 cm long and 1/4 cm wide area.  The other abrasions are much more superficial.  There are no foreign bodies.   ED Results / Procedures / Treatments   Labs (all labs ordered are listed, but only abnormal results are displayed) Labs Reviewed - No data to display   EKG     RADIOLOGY     PROCEDURES:  Critical Care performed:   Procedures   MEDICATIONS ORDERED IN ED: Medications - No data to display   IMPRESSION / MDM / ASSESSMENT AND PLAN / ED COURSE  I reviewed the triage vital signs and the nursing notes.                               Differential diagnosis includes, but is not limited to, dog bite.  Dog reportedly is up-to-date on its rabies vaccination so that should not be a problem.  Dog is being quarantined by animal control anyway.  Patient's wound was cleaned with peroxide by me and dressed by the nurse.  I will have her keep it covered but clean it every day in the shower.  I will give her some Augmentin basically to prophylax against infection since the subcu fat was exposed and the one bite.  No foreign bodies were seen or palpated.  No puncture was seen.  No x-rays were taken.  I did not consider admitting the patient.         FINAL CLINICAL IMPRESSION(S) / ED DIAGNOSES   Final diagnoses:  Dog bite, initial encounter  Rx / DC Orders   ED Discharge Orders          Ordered    amoxicillin-clavulanate (AUGMENTIN) 875-125 MG tablet  2 times daily        05/01/21 0942             Note:  This document was prepared using Dragon voice recognition software and may include unintentional dictation errors.   Arnaldo Natal, MD 05/01/21 1310

## 2021-05-01 NOTE — Discharge Instructions (Addendum)
Keep the wound clean and dry.  You may shower and you may wash in the shower with soap and water.  I will give you some Augmentin 1 twice a day take help prevent any infection.  Take it with food to help prevent diarrhea.  If you develop bad diarrhea stop it and come back.  Please return if it gets red or swollen or posterior have any other problems.  Animal control will contact you if there is any problem with the dog.  Do not hesitate to return if you feel the need.
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
## 2021-05-21 NOTE — Progress Notes (Deleted)
There were no vitals taken for this visit.   Subjective:    [name], female    DOB: 12-22-1970, 51 y.o.   MRN: [id number]  HPI: Heather Costa is a 51 y.o. female  No chief complaint on file.  SLEEP DISTURBANCE Patient states for the last couple of months she will fall asleep at 8-9pm then up from 11-2. She has been having very erratic sleep this has started. She hasn't had a period in 2 months.  Her mood is affected by sleep.  Patient states she has been taking black cohosh and prim rose and last night she was able to sleep until 530 this morning.   FIBROMYALGIA Patient states she takes the Gabapentin PRN because it makes her not feel right.  Even taking the gabapentin at bedtime does not help her sleep and she wakes up very groggy due to taking the medication.    Relevant past medical, surgical, family and social history reviewed and updated as indicated. Interim medical history since our last visit reviewed. Allergies and medications reviewed and updated.  Review of Systems  Endocrine: Positive for cold intolerance.  Psychiatric/Behavioral:  Positive for dysphoric mood and sleep disturbance. The patient is nervous/anxious.    Per HPI unless specifically indicated above     Objective:    There were no vitals taken for this visit.  Wt Readings from Last 3 Encounters:  05/01/21 119 lb (54 kg)  04/21/21 119 lb (54 kg)  12/09/20 118 lb 6.4 oz (53.7 kg)    Physical Exam Vitals and nursing note reviewed.  Constitutional:      General: She is not in acute distress.    Appearance: Normal appearance. She is normal weight. She is not ill-appearing, toxic-appearing or diaphoretic.  HENT:     Head: Normocephalic.     Right Ear: Tympanic membrane and external ear normal.     Left Ear: Tympanic membrane and external ear normal.     Nose: Congestion and rhinorrhea present.     Right Sinus: Maxillary sinus tenderness and frontal sinus tenderness present.      Left Sinus: Maxillary sinus tenderness and frontal sinus tenderness present.     Mouth/Throat:     Mouth: Mucous membranes are moist.     Pharynx: Posterior oropharyngeal erythema present. No oropharyngeal exudate.  Eyes:     General:        Right eye: No discharge.        Left eye: No discharge.     Extraocular Movements: Extraocular movements intact.     Conjunctiva/sclera: Conjunctivae normal.     Pupils: Pupils are equal, round, and reactive to light.  Cardiovascular:     Rate and Rhythm: Normal rate and regular rhythm.     Heart sounds: No murmur heard. Pulmonary:     Effort: Pulmonary effort is normal. No respiratory distress.     Breath sounds: Normal breath sounds. No wheezing or rales.  Musculoskeletal:     Cervical back: Normal range of motion and neck supple.  Skin:    General: Skin is warm and dry.     Capillary Refill: Capillary refill takes less than 2 seconds.  Neurological:     General: No focal deficit present.     Mental Status: She is alert and oriented to person, place, and time. Mental status is at baseline.  Psychiatric:        Mood and Affect: Mood normal.        Behavior: Behavior  normal.        Thought Content: Thought content normal.        Judgment: Judgment normal.    Results for orders placed or performed in visit on 04/21/21  Novel Coronavirus, NAA ([facility])   Specimen: Nasopharyngeal(NP) swabs in vial transport medium  Result Value Ref Range   SARS-CoV-2, NAA Not Detected Not Detected  SARS-COV-2, NAA 2 DAY TAT  Result Value Ref Range   SARS-CoV-2, NAA 2 DAY TAT Performed   Veritor Flu A/B Waived  Result Value Ref Range   Influenza A Negative Negative   Influenza B Negative Negative      Assessment & Plan:   Problem List Items Addressed This Visit   None    Follow up plan: No follow-ups on file.   A total of 30 minutes were spent on this encounter today.  When total time is documented, this includes both the face-to-face and  non-face-to-face time personally spent before, during and after the visit on the date of the encounter reviewing symptoms and developing plan of care with patient.

## 2021-05-24 ENCOUNTER — Ambulatory Visit: Payer: 59 | Admitting: Nurse Practitioner

## 2021-06-06 ENCOUNTER — Encounter: Payer: Self-pay | Admitting: Nurse Practitioner

## 2021-06-11 ENCOUNTER — Ambulatory Visit: Payer: 59 | Admitting: Nurse Practitioner

## 2021-06-11 ENCOUNTER — Other Ambulatory Visit: Payer: Self-pay

## 2021-06-11 ENCOUNTER — Encounter: Payer: Self-pay | Admitting: Nurse Practitioner

## 2021-06-11 VITALS — BP 109/61 | HR 65 | Temp 98.3°F | Wt 124.4 lb

## 2021-06-11 DIAGNOSIS — M797 Fibromyalgia: Secondary | ICD-10-CM | POA: Diagnosis not present

## 2021-06-11 DIAGNOSIS — F330 Major depressive disorder, recurrent, mild: Secondary | ICD-10-CM | POA: Insufficient documentation

## 2021-06-11 MED ORDER — GABAPENTIN 600 MG PO TABS: 600.0000 mg | ORAL_TABLET | Freq: Three times a day (TID) | ORAL | 0 refills | Status: DC
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
## 2021-06-11 NOTE — Assessment & Plan Note (Signed)
Chronic. Not well controlled.  Encouraged patient to use Buspar over the next week to see if symptoms improve. Will follow up in 1 week for reevaluation and medication adjustment.
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
## 2021-06-18 ENCOUNTER — Other Ambulatory Visit: Payer: Self-pay

## 2021-06-18 ENCOUNTER — Encounter: Payer: Self-pay | Admitting: Nurse Practitioner

## 2021-06-18 ENCOUNTER — Ambulatory Visit (INDEPENDENT_AMBULATORY_CARE_PROVIDER_SITE_OTHER): Payer: 59 | Admitting: Nurse Practitioner

## 2021-06-18 VITALS — BP 118/76 | HR 64 | Temp 99.3°F | Wt 121.6 lb

## 2021-06-18 DIAGNOSIS — F330 Major depressive disorder, recurrent, mild: Secondary | ICD-10-CM

## 2021-06-18 DIAGNOSIS — F419 Anxiety disorder, unspecified: Secondary | ICD-10-CM

## 2021-06-18 DIAGNOSIS — M797 Fibromyalgia: Secondary | ICD-10-CM

## 2021-06-18 MED ORDER — METHOCARBAMOL 500 MG PO TABS: 500.0000 mg | ORAL_TABLET | Freq: Three times a day (TID) | ORAL | 1 refills | Status: DC

## 2021-06-18 MED ORDER — DICLOFENAC SODIUM 1 % EX GEL: 4.0000 g | Freq: Four times a day (QID) | CUTANEOUS | 0 refills | Status: DC

## 2021-06-18 MED ORDER — OMEPRAZOLE 40 MG PO CPDR: 40.0000 mg | DELAYED_RELEASE_CAPSULE | Freq: Every day | ORAL | 1 refills | Status: DC

## 2021-06-18 MED ORDER — HYDROXYZINE HCL 25 MG PO TABS: 25.0000 mg | ORAL_TABLET | Freq: Three times a day (TID) | ORAL | 1 refills | Status: DC | PRN

## 2021-06-18 MED ORDER — SUMATRIPTAN SUCCINATE 50 MG PO TABS: ORAL_TABLET | ORAL | 1 refills | Status: DC

## 2021-06-18 MED ORDER — BUSPIRONE HCL 5 MG PO TABS: 5.0000 mg | ORAL_TABLET | Freq: Three times a day (TID) | ORAL | 0 refills | Status: DC | PRN

## 2021-06-18 NOTE — Assessment & Plan Note (Signed)
Chronic. The increase Gabapentin has been helpful.  She is only able to tolerate it BID.  Conitnue with current dose.  Discussed using voltaren gel as needed for muscle spasm.  Use Robaxin PRN for muscle spasms.  Follow up in 6 weeks for reevaluation. ?

## 2021-06-18 NOTE — Progress Notes (Signed)
? ?BP 118/76   Pulse 64   Temp 99.3 ?F (37.4 ?C) (Oral)   Wt 121 lb 9.6 oz (55.2 kg)   SpO2 98%   BMI 24.56 kg/m?   ? ?Subjective:  ? ? [name], female    DOB: 1970-09-25, 51 y.o.   MRN: [id number] ? ?HPI: ?Heather Costa is a 51 y.o. female ? ?Chief Complaint  ?Patient presents with  ? Depression  ? ? ? ?ANXIETY/DEPRESSION ?Patient states the buspar is working okay.  It has only been a week since she has been on it.  States she is going to go visit her Dad who has decided to go on Hospice. This adds more stress to her.   ?Patient states there are parts that are getting better.  She does have a pain that is around her ribs and back and going down her leg.  States it came on a couple of days ago.  She has been coughing.   ? ?FIBROMYALGIA ?Patient states she stopped taking the gabapentin 3 times.  It was making her feel like a zombie.  She is taking it BID.  She states it is still helping with her pain and helping her get some sleep.  The robaxin helps with her muscle spasms which helps her sleep too.   ? ? ?Relevant past medical, surgical, family and social history reviewed and updated as indicated. Interim medical history since our last visit reviewed. ?Allergies and medications reviewed and updated. ? ?Review of Systems  ?Musculoskeletal:  Positive for back pain and myalgias.  ?Psychiatric/Behavioral:  Positive for dysphoric mood and sleep disturbance. The patient is nervous/anxious.   ? ?Per HPI unless specifically indicated above ? ?   ?Objective:  ?  ?BP 118/76   Pulse 64   Temp 99.3 ?F (37.4 ?C) (Oral)   Wt 121 lb 9.6 oz (55.2 kg)   SpO2 98%   BMI 24.56 kg/m?   ?Wt Readings from Last 3 Encounters:  ?06/18/21 121 lb 9.6 oz (55.2 kg)  ?06/11/21 124 lb 6.4 oz (56.4 kg)  ?05/01/21 119 lb (54 kg)  ?  ?Physical Exam ?Vitals and nursing note reviewed.  ?Constitutional:   ?   General: She is not in acute distress. ?   Appearance: Normal appearance. She is normal weight. She is not  ill-appearing, toxic-appearing or diaphoretic.  ?HENT:  ?   Head: Normocephalic.  ?   Right Ear: External ear normal.  ?   Left Ear: External ear normal.  ?   Nose: Nose normal.  ?   Mouth/Throat:  ?   Mouth: Mucous membranes are moist.  ?   Pharynx: Oropharynx is clear.  ?Eyes:  ?   General:     ?   Right eye: No discharge.     ?   Left eye: No discharge.  ?   Extraocular Movements: Extraocular movements intact.  ?   Conjunctiva/sclera: Conjunctivae normal.  ?   Pupils: Pupils are equal, round, and reactive to light.  ?Cardiovascular:  ?   Rate and Rhythm: Normal rate and regular rhythm.  ?   Heart sounds: No murmur heard. ?Pulmonary:  ?   Effort: Pulmonary effort is normal. No respiratory distress.  ?   Breath sounds: Normal breath sounds. No wheezing or rales.  ?Musculoskeletal:  ?   Cervical back: Normal range of motion and neck supple.  ?Skin: ?   General: Skin is warm and dry.  ?   Capillary Refill: Capillary refill takes  less than 2 seconds.  ?Neurological:  ?   General: No focal deficit present.  ?   Mental Status: She is alert and oriented to person, place, and time. Mental status is at baseline.  ?Psychiatric:     ?   Mood and Affect: Mood normal.     ?   Behavior: Behavior normal.     ?   Thought Content: Thought content normal.     ?   Judgment: Judgment normal.  ? ? ?Results for orders placed or performed in visit on 04/21/21  ?Novel Coronavirus, NAA ([facility])  ? Specimen: Nasopharyngeal(NP) swabs in vial transport medium  ?Result Value Ref Range  ? SARS-CoV-2, NAA Not Detected Not Detected  ?SARS-COV-2, NAA 2 DAY TAT  ?Result Value Ref Range  ? SARS-CoV-2, NAA 2 DAY TAT Performed   ?Veritor Flu A/B Waived  ?Result Value Ref Range  ? Influenza A Negative Negative  ? Influenza B Negative Negative  ? ?   ?Assessment & Plan:  ? ?Problem List Items Addressed This Visit   ? ?  ? Other  ? Fibromyalgia  ?  Chronic. The increase Gabapentin has been helpful.  She is only able to tolerate it BID.  Conitnue with  current dose.  Discussed using voltaren gel as needed for muscle spasm.  Use Robaxin PRN for muscle spasms.  Follow up in 6 weeks for reevaluation. ?  ?  ? Relevant Medications  ? methocarbamol (ROBAXIN) 500 MG tablet  ? Anxiety  ?  Chronic.  Improved from last week but still exacerbated due to her Dad being ill.  She is planning to have FMLA paperwork filled out for leave of absence from work to help care for him.  [place] live [place] of [place].  Will continue with Buspar 5mg  BID right now.  Will follow up in 6 weeks once patient returns from helping her dad.  ?  ?  ? Relevant Medications  ? busPIRone (BUSPAR) 5 MG tablet  ? hydrOXYzine (ATARAX) 25 MG tablet  ? Mild episode of recurrent major depressive disorder (HCC) - Primary  ?  Chronic.  Improved from last week but still exacerbated due to her Dad being ill.  She is planning to have FMLA paperwork filled out for leave of absence from work to help care for him.  He does live out of state.  Will continue with Buspar 5mg  BID right now.  Will follow up in 6 weeks once patient returns from helping her dad.  ?  ?  ? Relevant Medications  ? busPIRone (BUSPAR) 5 MG tablet  ? hydrOXYzine (ATARAX) 25 MG tablet  ?  ? ?Follow up plan: ?Return in about 6 weeks (around 07/30/2021) for Depression/Anxiety FU. ? ? ? ? ? ? ?

## 2021-06-18 NOTE — Assessment & Plan Note (Signed)
Chronic.  Improved from last week but still exacerbated due to her Dad being ill.  She is planning to have FMLA paperwork filled out for leave of absence from work to help care for him.  He does live out of state.  Will continue with Buspar 5mg  BID right now.  Will follow up in 6 weeks once patient returns from helping her dad.  ?

## 2021-06-18 NOTE — Assessment & Plan Note (Signed)
Chronic.  Improved from last week but still exacerbated due to her Dad being ill.  She is planning to have FMLA paperwork filled out for leave of absence from work to help care for him.  He does live out of state.  Will continue with Buspar 5mg BID right now.  Will follow up in 6 weeks once patient returns from helping her dad.  ?

## 2021-06-23 ENCOUNTER — Encounter: Payer: Self-pay | Admitting: Nurse Practitioner

## 2021-06-24 ENCOUNTER — Encounter: Payer: Self-pay | Admitting: Nurse Practitioner

## 2021-06-26 ENCOUNTER — Encounter: Payer: Self-pay | Admitting: Nurse Practitioner

## 2021-07-10 ENCOUNTER — Other Ambulatory Visit: Payer: Self-pay | Admitting: Nurse Practitioner

## 2021-07-13 NOTE — Telephone Encounter (Signed)
Requested Prescriptions  ?Pending Prescriptions Disp Refills  ?? diclofenac Sodium (VOLTAREN) 1 % GEL [Pharmacy Med Name: DICLOFENAC SODIUM 1% GEL] 100 g 0  ?  Sig: Apply 4 g topically 4 (four) times daily.  ?  ? Analgesics:  Topicals Failed - 07/10/2021 12:30 PM  ?  ?  Failed - Manual Review: Labs are only required if the patient has taken medication for more than 8 weeks.  ?  ?  Passed - PLT in normal range and within 360 days  ?  Platelets  ?Date Value Ref Range Status  ?12/09/2020 291 150 - 450 x10E3/uL Final  ?   ?  ?  Passed - HGB in normal range and within 360 days  ?  Hemoglobin  ?Date Value Ref Range Status  ?12/09/2020 14.6 11.1 - 15.9 g/dL Final  ?   ?  ?  Passed - HCT in normal range and within 360 days  ?  Hematocrit  ?Date Value Ref Range Status  ?12/09/2020 43.9 34.0 - 46.6 % Final  ?   ?  ?  Passed - Cr in normal range and within 360 days  ?  Creatinine, Ser  ?Date Value Ref Range Status  ?12/09/2020 0.61 0.57 - 1.00 mg/dL Final  ?   ?  ?  Passed - eGFR is 30 or above and within 360 days  ?  GFR calc Af Amer  ?Date Value Ref Range Status  ?08/17/2017 127 >59 mL/min/1.73 Final  ? ?GFR calc non Af Amer  ?Date Value Ref Range Status  ?08/17/2017 110 >59 mL/min/1.73 Final  ? ?eGFR  ?Date Value Ref Range Status  ?12/09/2020 108 >59 mL/min/1.73 Final  ?   ?  ?  Passed - Patient is not pregnant  ?  ?  Passed - Valid encounter within last 12 months  ?  Recent Outpatient Visits   ?      ? 3 weeks ago Mild episode of recurrent major depressive disorder ([place])  ? [place], NP  ? 1 month ago Mild episode of recurrent major depressive disorder ([place])  ? [place], NP  ? 2 months ago Menopause  ? [place], NP  ? 7 months ago Annual physical exam  ? [place], NP  ? 8 months ago Acute non-recurrent maxillary sinusitis  ? [facility] Jon Billings, NP  ?  ?  ?Future  Appointments   ?        ? In 3 weeks Jon Billings, NP [facility]  ?  ? ?  ?  ?  ? ? ?

## 2021-07-16 ENCOUNTER — Encounter: Payer: Self-pay | Admitting: Nurse Practitioner

## 2021-07-16 ENCOUNTER — Ambulatory Visit: Payer: 59 | Admitting: Nurse Practitioner

## 2021-07-16 ENCOUNTER — Ambulatory Visit
Admission: RE | Admit: 2021-07-16 | Discharge: 2021-07-16 | Disposition: A | Discharge status: Home / Self Care | Payer: 59 | Source: Ambulatory Visit | Attending: Nurse Practitioner | Admitting: Nurse Practitioner

## 2021-07-16 ENCOUNTER — Other Ambulatory Visit: Payer: Self-pay

## 2021-07-16 ENCOUNTER — Ambulatory Visit
Admission: RE | Admit: 2021-07-16 | Discharge: 2021-07-16 | Disposition: A | Discharge status: Home / Self Care | Payer: 59 | Attending: Nurse Practitioner | Admitting: Nurse Practitioner

## 2021-07-16 VITALS — BP 139/89 | HR 65 | Temp 97.6°F | Wt 125.2 lb

## 2021-07-16 DIAGNOSIS — M546 Pain in thoracic spine: Secondary | ICD-10-CM
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
## 2021-07-19 ENCOUNTER — Encounter: Payer: Self-pay | Admitting: Nurse Practitioner

## 2021-07-19 NOTE — Progress Notes (Signed)
Hi Aleiya. Your xray was normal.

## 2021-07-21 ENCOUNTER — Ambulatory Visit: Payer: 59 | Admitting: Nurse Practitioner

## 2021-07-27 ENCOUNTER — Encounter: Payer: Self-pay | Admitting: Family Medicine

## 2021-07-27 ENCOUNTER — Encounter: Payer: Self-pay | Admitting: Nurse Practitioner

## 2021-07-27 NOTE — Telephone Encounter (Signed)
Note written and sent to patient's mychart ?

## 2021-08-04 NOTE — Progress Notes (Deleted)
There were no vitals taken for this visit.   Subjective:    [name], female    DOB: [birth year], 51 y.o.   MRN: [id number]  HPI: Heather Costa is a 51 y.o. female  No chief complaint on file.    ANXIETY/DEPRESSION Patient states the buspar is working okay.  It has only been a week since she has been on it.  States she is going to go visit her Dad who has decided to go on Hospice. This adds more stress to her.   Patient states there are parts that are getting better.  She does have a pain that is around her ribs and back and going down her leg.  States it came on a couple of days ago.  She has been coughing.    FIBROMYALGIA Patient states she stopped taking the gabapentin 3 times.  It was making her feel like a zombie.  She is taking it BID.  She states it is still helping with her pain and helping her get some sleep.  The robaxin helps with her muscle spasms which helps her sleep too.     Relevant past medical, surgical, family and social history reviewed and updated as indicated. Interim medical history since our last visit reviewed. Allergies and medications reviewed and updated.  Review of Systems  Musculoskeletal:  Positive for back pain and myalgias.  Psychiatric/Behavioral:  Positive for dysphoric mood and sleep disturbance. The patient is nervous/anxious.    Per HPI unless specifically indicated above     Objective:    There were no vitals taken for this visit.  Wt Readings from Last 3 Encounters:  07/16/21 125 lb 3.2 oz (56.8 kg)  06/18/21 121 lb 9.6 oz (55.2 kg)  06/11/21 124 lb 6.4 oz (56.4 kg)    Physical Exam Vitals and nursing note reviewed.  Constitutional:      General: She is not in acute distress.    Appearance: Normal appearance. She is normal weight. She is not ill-appearing, toxic-appearing or diaphoretic.  HENT:     Head: Normocephalic.     Right Ear: External ear normal.     Left Ear: External ear normal.     Nose: Nose  normal.     Mouth/Throat:     Mouth: Mucous membranes are moist.     Pharynx: Oropharynx is clear.  Eyes:     General:        Right eye: No discharge.        Left eye: No discharge.     Extraocular Movements: Extraocular movements intact.     Conjunctiva/sclera: Conjunctivae normal.     Pupils: Pupils are equal, round, and reactive to light.  Cardiovascular:     Rate and Rhythm: Normal rate and regular rhythm.     Heart sounds: No murmur heard. Pulmonary:     Effort: Pulmonary effort is normal. No respiratory distress.     Breath sounds: Normal breath sounds. No wheezing or rales.  Musculoskeletal:     Cervical back: Normal range of motion and neck supple.  Skin:    General: Skin is warm and dry.     Capillary Refill: Capillary refill takes less than 2 seconds.  Neurological:     General: No focal deficit present.     Mental Status: She is alert and oriented to person, place, and time. Mental status is at baseline.  Psychiatric:        Mood and Affect: Mood normal.  Behavior: Behavior normal.        Thought Content: Thought content normal.        Judgment: Judgment normal.    Results for orders placed or performed in visit on 04/21/21  Novel Coronavirus, NAA ([facility])   Specimen: Nasopharyngeal(NP) swabs in vial transport medium  Result Value Ref Range   SARS-CoV-2, NAA Not Detected Not Detected  SARS-COV-2, NAA 2 DAY TAT  Result Value Ref Range   SARS-CoV-2, NAA 2 DAY TAT Performed   Veritor Flu A/B Waived  Result Value Ref Range   Influenza A Negative Negative   Influenza B Negative Negative      Assessment & Plan:   Problem List Items Addressed This Visit       Other   Anxiety   Mild episode of recurrent major depressive disorder (HCC) - Primary     Follow up plan: No follow-ups on file.

## 2021-08-05 ENCOUNTER — Ambulatory Visit: Payer: 59 | Admitting: Nurse Practitioner

## 2021-08-05 DIAGNOSIS — F419 Anxiety disorder, unspecified: Secondary | ICD-10-CM

## 2021-08-05 DIAGNOSIS — F330 Major depressive disorder, recurrent, mild: Secondary | ICD-10-CM

## 2021-09-02 ENCOUNTER — Ambulatory Visit: Payer: 59 | Admitting: Nurse Practitioner

## 2021-10-11 ENCOUNTER — Ambulatory Visit: Payer: 59 | Admitting: Nurse Practitioner

## 2021-10-11 ENCOUNTER — Encounter: Payer: Self-pay | Admitting: Nurse Practitioner

## 2021-10-11 VITALS — BP 107/71 | HR 77 | Temp 98.7°F | Wt 122.8 lb

## 2021-10-11 DIAGNOSIS — J0110 Acute frontal sinusitis, unspecified: Secondary | ICD-10-CM | POA: Diagnosis not present

## 2021-10-11 MED ORDER — FLUCONAZOLE 150 MG PO TABS: 150.0000 mg | ORAL_TABLET | Freq: Once | ORAL | 0 refills | Status: AC

## 2021-10-11 MED ORDER — DICLOFENAC SODIUM 1 % EX GEL
4.0000 g | Freq: Four times a day (QID) | CUTANEOUS | 0 refills | Status: AC
Start: 2021-10-11 — End: ?

## 2021-10-11 MED ORDER — AMOXICILLIN-POT CLAVULANATE 875-125 MG PO TABS: 1.0000 | ORAL_TABLET | Freq: Two times a day (BID) | ORAL | 0 refills | Status: AC

## 2021-10-11 MED ORDER — GABAPENTIN 600 MG PO TABS: 600.0000 mg | ORAL_TABLET | Freq: Three times a day (TID) | ORAL | 1 refills | Status: DC

## 2021-12-10 ENCOUNTER — Other Ambulatory Visit: Payer: Self-pay

## 2021-12-10 DIAGNOSIS — Z1231 Encounter for screening mammogram for malignant neoplasm of breast: Secondary | ICD-10-CM

## 2021-12-14 ENCOUNTER — Other Ambulatory Visit: Payer: Self-pay

## 2021-12-14 DIAGNOSIS — R921 Mammographic calcification found on diagnostic imaging of breast: Secondary | ICD-10-CM

## 2021-12-31 ENCOUNTER — Inpatient Hospital Stay: Admission: RE | Admit: 2021-12-31 | Payer: 59 | Source: Ambulatory Visit

## 2021-12-31 ENCOUNTER — Other Ambulatory Visit: Payer: 59

## 2022-04-24 ENCOUNTER — Other Ambulatory Visit: Payer: Self-pay | Admitting: Nurse Practitioner

## 2022-05-24 ENCOUNTER — Encounter: Payer: Self-pay | Admitting: Nurse Practitioner

## 2022-05-24 ENCOUNTER — Ambulatory Visit (INDEPENDENT_AMBULATORY_CARE_PROVIDER_SITE_OTHER): Payer: 59 | Admitting: Nurse Practitioner

## 2022-05-24 VITALS — BP 136/80 | HR 66 | Temp 98.1°F | Wt 127.2 lb

## 2022-05-24 DIAGNOSIS — Z202 Contact with and (suspected) exposure to infections with a predominantly sexual mode of transmission: Secondary | ICD-10-CM | POA: Diagnosis not present

## 2022-05-24 DIAGNOSIS — N898 Other specified noninflammatory disorders of vagina: Secondary | ICD-10-CM | POA: Diagnosis not present

## 2022-05-24 MED ORDER — HYDROXYZINE HCL 25 MG PO TABS: 25.0000 mg | ORAL_TABLET | Freq: Three times a day (TID) | ORAL | 0 refills | Status: DC | PRN

## 2022-05-24 MED ORDER — OMEPRAZOLE 40 MG PO CPDR: 40.0000 mg | DELAYED_RELEASE_CAPSULE | Freq: Every day | ORAL | 0 refills | Status: DC

## 2022-05-24 MED ORDER — SUMATRIPTAN SUCCINATE 50 MG PO TABS: ORAL_TABLET | ORAL | 1 refills | Status: DC

## 2022-05-24 MED ORDER — BUSPIRONE HCL 5 MG PO TABS: 5.0000 mg | ORAL_TABLET | Freq: Three times a day (TID) | ORAL | 0 refills | Status: DC | PRN

## 2022-05-24 MED ORDER — METRONIDAZOLE 500 MG PO TABS: 500.0000 mg | ORAL_TABLET | Freq: Two times a day (BID) | ORAL | 0 refills | Status: AC

## 2022-05-24 MED ORDER — GABAPENTIN 600 MG PO TABS: 600.0000 mg | ORAL_TABLET | Freq: Three times a day (TID) | ORAL | 0 refills | Status: DC

## 2022-05-24 MED ORDER — METHOCARBAMOL 500 MG PO TABS: 500.0000 mg | ORAL_TABLET | Freq: Three times a day (TID) | ORAL | 0 refills | Status: DC

## 2022-05-24 NOTE — Progress Notes (Signed)
BP 136/80 (BP Location: Left Arm, Cuff Size: Normal)   Pulse 66   Temp 98.1 F (36.7 C) (Oral)   Wt 127 lb 3.2 oz (57.7 kg)   SpO2 98%   BMI 25.69 kg/m    Subjective:    [name], female    DOB: 1970-12-08, 52 y.o.   MRN: [id number]  HPI: Heather Costa is a 52 y.o. female  Chief Complaint  Patient presents with   STD Testing   Depression   Patient states she has a foul odor, discharge, pain in the vaginal area.  She was seeing someone awhile ago and feels like her symptoms are worse.      Relevant past medical, surgical, family and social history reviewed and updated as indicated. Interim medical history since our last visit reviewed. Allergies and medications reviewed and updated.  Review of Systems  Genitourinary:  Positive for vaginal discharge and vaginal pain.       Foul odor    Per HPI unless specifically indicated above     Objective:    BP 136/80 (BP Location: Left Arm, Cuff Size: Normal)   Pulse 66   Temp 98.1 F (36.7 C) (Oral)   Wt 127 lb 3.2 oz (57.7 kg)   SpO2 98%   BMI 25.69 kg/m   Wt Readings from Last 3 Encounters:  05/24/22 127 lb 3.2 oz (57.7 kg)  10/11/21 122 lb 12.8 oz (55.7 kg)  07/16/21 125 lb 3.2 oz (56.8 kg)    Physical Exam Vitals and nursing note reviewed.  Constitutional:      General: She is not in acute distress.    Appearance: Normal appearance. She is normal weight. She is not ill-appearing, toxic-appearing or diaphoretic.  HENT:     Head: Normocephalic.     Right Ear: External ear normal.     Left Ear: External ear normal.     Nose: Nose normal.     Mouth/Throat:     Mouth: Mucous membranes are moist.     Pharynx: Oropharynx is clear.  Eyes:     General:        Right eye: No discharge.        Left eye: No discharge.     Extraocular Movements: Extraocular movements intact.     Conjunctiva/sclera: Conjunctivae normal.     Pupils: Pupils are equal, round, and reactive to light.   Cardiovascular:     Rate and Rhythm: Normal rate and regular rhythm.     Heart sounds: No murmur heard. Pulmonary:     Effort: Pulmonary effort is normal. No respiratory distress.     Breath sounds: Normal breath sounds. No wheezing or rales.  Musculoskeletal:     Cervical back: Normal range of motion and neck supple.  Skin:    General: Skin is warm and dry.     Capillary Refill: Capillary refill takes less than 2 seconds.  Neurological:     General: No focal deficit present.     Mental Status: She is alert and oriented to person, place, and time. Mental status is at baseline.  Psychiatric:        Mood and Affect: Mood normal.        Behavior: Behavior normal.        Thought Content: Thought content normal.        Judgment: Judgment normal.     Results for orders placed or performed in visit on 04/21/21  Novel Coronavirus, NAA ([facility])   Specimen:  Nasopharyngeal(NP) swabs in vial transport medium  Result Value Ref Range   SARS-CoV-2, NAA Not Detected Not Detected  SARS-COV-2, NAA 2 DAY TAT  Result Value Ref Range   SARS-CoV-2, NAA 2 DAY TAT Performed   Veritor Flu A/B Waived  Result Value Ref Range   Influenza A Negative Negative   Influenza B Negative Negative      Assessment & Plan:   Problem List Items Addressed This Visit   None Visit Diagnoses     Vaginal discharge    -  Primary   Will treat with flagyl due to not being able to check for BV. Will send out STI testing. Will make recommendations based on lab results.   Relevant Orders   HSV 1 and 2 Ab, IgG   Exposure to STD       Relevant Orders   HIV Antibody (routine testing w rflx)   RPR   Hepatitis C antibody   Chlamydia/Gonococcus/Trichomonas, NAA        Follow up plan: Return in about 1 month (around 06/22/2022) for Physical and Fasting labs.

## 2022-05-26 LAB — HEPATITIS C ANTIBODY: Hep C Virus Ab: NONREACTIVE

## 2022-05-26 LAB — RPR: RPR Ser Ql: NONREACTIVE

## 2022-05-26 LAB — HSV 1 AND 2 AB, IGG
HSV 1 Glycoprotein G Ab, IgG: <0.91 index (ref 0.00–0.90)
HSV 2 IgG, Type Spec: >23.6 index — ABNORMAL HIGH (ref 0.00–0.90)

## 2022-05-26 LAB — HIV ANTIBODY (ROUTINE TESTING W REFLEX): HIV Screen 4th Generation wRfx: NONREACTIVE

## 2022-05-28 LAB — CHLAMYDIA/GONOCOCCUS/TRICHOMONAS, NAA
Chlamydia by NAA: NEGATIVE
Gonococcus by NAA: NEGATIVE
Trich vag by NAA: POSITIVE — AB

## 2022-05-30 NOTE — Progress Notes (Signed)
Hi Heather Costa.  Your test results came back and showed that you do have trichomonas which is an STI.  However, the flagyl I already treated you with will take care of it so you don't need further treatment.  Please let me know if you have any questions.

## 2022-06-22 ENCOUNTER — Encounter: Payer: 59 | Admitting: Nurse Practitioner

## 2022-06-22 NOTE — Progress Notes (Deleted)
There were no vitals taken for this visit.   Subjective:    [name], female    DOB: May 23, 1970, 52 y.o.   MRN: [id number]  HPI: Heather Costa is a 52 y.o. female presenting on 06/22/2022 for comprehensive medical examination. Current medical complaints include:{Blank single:19197::"none","***"}  She currently lives with: Menopausal Symptoms: {Blank single:19197::"yes","no"}  MOOD  Depression Screen done today and results listed below:     05/24/2022    3:58 PM 10/11/2021    1:32 PM 07/16/2021    8:14 AM 06/18/2021   11:29 AM 06/11/2021   11:19 AM  Depression screen PHQ 2/9  Decreased Interest 0 '1 2 1 1  '$ Down, Depressed, Hopeless '2 1 2 1 1  '$ PHQ - 2 Score '2 2 4 2 2  '$ Altered sleeping '3 3 2 1 3  '$ Tired, decreased energy '3 3 2 1 3  '$ Change in appetite '1 1 2 1 3  '$ Feeling bad or failure about yourself  0 1 1 0 1  Trouble concentrating '1 1 2 1 1  '$ Moving slowly or fidgety/restless 0 0 1 1 0  Suicidal thoughts 0 0 0 0 0  PHQ-9 Score '10 11 14 7 13  '$ Difficult doing work/chores Somewhat difficult Somewhat difficult Somewhat difficult Somewhat difficult Somewhat difficult    The patient {has/does not have:19849} a history of falls. I {did/did not:19850} complete a risk assessment for falls. A plan of care for falls {was/was not:19852} documented.   Past Medical History:  Past Medical History:  Diagnosis Date   Allergy    Anxiety    Depression    Depression with anxiety    Family history of adverse reaction to anesthesia    Father - slow to wake   Fibromyalgia    GERD (gastroesophageal reflux disease)    Migraine headache    1-2x/mo   Neuromuscular disorder [facility])     Surgical History:  Past Surgical History:  Procedure Laterality Date   COLONOSCOPY WITH PROPOFOL N/A 09/30/2019   Procedure: COLONOSCOPY WITH PROPOFOL, POLYPECTOMY;  Surgeon: [name], MD;  Location: [facility];  Service: Endoscopy;  Laterality: N/A;   Dysplasia Procedures      TUBAL LIGATION      Medications:  Current Outpatient Medications on File Prior to Visit  Medication Sig   busPIRone (BUSPAR) 5 MG tablet Take 1 tablet (5 mg total) by mouth 3 (three) times daily as needed.   diclofenac Sodium (VOLTAREN) 1 % GEL Apply 4 g topically 4 (four) times daily.   gabapentin (NEURONTIN) 600 MG tablet Take 1 tablet (600 mg total) by mouth 3 (three) times daily.   hydrOXYzine (ATARAX) 25 MG tablet Take 1 tablet (25 mg total) by mouth 3 (three) times daily as needed.   methocarbamol (ROBAXIN) 500 MG tablet Take 1 tablet (500 mg total) by mouth 3 (three) times daily.   omeprazole (PRILOSEC) 40 MG capsule Take 1 capsule (40 mg total) by mouth daily.   SUMAtriptan (IMITREX) 50 MG tablet TAKE 1 TABLET BY MOUTH EVERY 2 HOURS AS NEEDED FOR MIGRAINE. MAY REPEAT IN 2 HOURS IF HEADACHE PERSISTS OR RECURS.   No current facility-administered medications on file prior to visit.    Allergies:  Allergies  Allergen Reactions   Sulfa Antibiotics Anaphylaxis    Social History:  Social History   Socioeconomic History   Marital status: Married    Spouse name: Not on file   Number of children: Not on file   Years of  education: Not on file   Highest education level: Not on file  Occupational History   Not on file  Tobacco Use   Smoking status: Every Day    Packs/day: 0.50    Years: 30.00    Total pack years: 15.00    Types: Cigarettes   Smokeless tobacco: Never   Tobacco comments:    since age 53  Vaping Use   Vaping Use: Never used  Substance and Sexual Activity   Alcohol use: Not Currently    Comment: on occasion   Drug use: No   Sexual activity: Yes  Other Topics Concern   Not on file  Social History Narrative   Not on file   Social Determinants of Health   Financial Resource Strain: Not on file  Food Insecurity: Not on file  Transportation Needs: Not on file  Physical Activity: Not on file  Stress: Not on file  Social Connections: Not on file   Intimate Partner Violence: Not on file   Social History   Tobacco Use  Smoking Status Every Day   Packs/day: 0.50   Years: 30.00   Total pack years: 15.00   Types: Cigarettes  Smokeless Tobacco Never  Tobacco Comments   since age 21   Social History   Substance and Sexual Activity  Alcohol Use Not Currently   Comment: on occasion    Family History:  Family History  Problem Relation Age of Onset   Healthy Mother    Healthy Father    Cervical cancer Daughter    Leukemia Paternal Uncle    Emphysema Paternal Grandmother    Emphysema Paternal Grandfather     Past medical history, surgical history, medications, allergies, family history and social history reviewed with patient today and changes made to appropriate areas of the chart.   ROS All other ROS negative except what is listed above and in the HPI.      Objective:    There were no vitals taken for this visit.  Wt Readings from Last 3 Encounters:  05/24/22 127 lb 3.2 oz (57.7 kg)  10/11/21 122 lb 12.8 oz (55.7 kg)  07/16/21 125 lb 3.2 oz (56.8 kg)    Physical Exam  Results for orders placed or performed in visit on 05/24/22  Chlamydia/Gonococcus/Trichomonas, NAA  Result Value Ref Range   Chlamydia by NAA Negative Negative   Gonococcus by NAA Negative Negative   Trich vag by NAA Positive (A) Negative  HIV Antibody (routine testing w rflx)  Result Value Ref Range   HIV Screen 4th Generation wRfx Non Reactive Non Reactive  RPR  Result Value Ref Range   RPR Ser Ql Non Reactive Non Reactive  Hepatitis C antibody  Result Value Ref Range   Hep C Virus Ab Non Reactive Non Reactive  HSV 1 and 2 Ab, IgG  Result Value Ref Range   HSV 1 Glycoprotein G Ab, IgG <0.91 0.00 - 0.90 index   HSV 2 IgG, Type Spec >23.60 (H) 0.00 - 0.90 index      Assessment & Plan:   Problem List Items Addressed This Visit       Other   Anxiety   Mild episode of recurrent major depressive disorder ([place]) - Primary      Follow up plan: No follow-ups on file.   LABORATORY TESTING:  - Pap smear: {Blank [id number] done","not applicable","up to date","done elsewhere"}  IMMUNIZATIONS:   - Tdap: Tetanus vaccination status reviewed: {tetanus status:315746}. - Influenza: {Blank single:19197::"Up to date","Administered  today","Postponed to flu season","Refused","Given elsewhere"} - Pneumovax: {Blank single:19197::"Up to date","Administered today","Not applicable","Refused","Given elsewhere"} - Prevnar: {Blank single:19197::"Up to date","Administered today","Not applicable","Refused","Given elsewhere"} - COVID: {Blank single:19197::"Up to date","Administered today","Not applicable","Refused","Given elsewhere"} - HPV: {Blank single:19197::"Up to date","Administered today","Not applicable","Refused","Given elsewhere"} - Shingrix vaccine: {Blank single:19197::"Up to date","Administered today","Not applicable","Refused","Given elsewhere"}  SCREENING: -Mammogram: {Blank single:19197::"Up to date","Ordered today","Not applicable","Refused","Done elsewhere"}  - Colonoscopy: {Blank single:19197::"Up to date","Ordered today","Not applicable","Refused","Done elsewhere"}  - Bone Density: {Blank single:19197::"Up to date","Ordered today","Not applicable","Refused","Done elsewhere"}  -Hearing Test: {Blank single:19197::"Up to date","Ordered today","Not applicable","Refused","Done elsewhere"}  -Spirometry: {Blank single:19197::"Up to date","Ordered today","Not applicable","Refused","Done elsewhere"}   PATIENT COUNSELING:   Advised to take 1 mg of folate supplement per day if capable of pregnancy.   Sexuality: Discussed sexually transmitted diseases, partner selection, use of condoms, avoidance of unintended pregnancy  and contraceptive alternatives.   Advised to avoid cigarette smoking.  I discussed with the patient that most people either abstain from alcohol or drink within safe limits (<=14/week and <=4  drinks/occasion for males, <=7/weeks and <= 3 drinks/occasion for females) and that the risk for alcohol disorders and other health effects rises proportionally with the number of drinks per week and how often a drinker exceeds daily limits.  Discussed cessation/primary prevention of drug use and availability of treatment for abuse.   Diet: Encouraged to adjust caloric intake to maintain  or achieve ideal body weight, to reduce intake of dietary saturated fat and total fat, to limit sodium intake by avoiding high sodium foods and not adding table salt, and to maintain adequate dietary potassium and calcium preferably from fresh fruits, vegetables, and low-fat dairy products.    stressed the importance of regular exercise  Injury prevention: Discussed safety belts, safety helmets, smoke detector, smoking near bedding or upholstery.   Dental health: Discussed importance of regular tooth brushing, flossing, and dental visits.    NEXT PREVENTATIVE PHYSICAL DUE IN 1 YEAR. No follow-ups on file.

## 2022-08-25 ENCOUNTER — Encounter: Payer: Self-pay | Admitting: Nurse Practitioner

## 2022-08-25 ENCOUNTER — Ambulatory Visit (INDEPENDENT_AMBULATORY_CARE_PROVIDER_SITE_OTHER): Payer: 59 | Admitting: Nurse Practitioner

## 2022-08-25 VITALS — BP 134/87 | HR 65 | Temp 98.9°F | Wt 124.0 lb

## 2022-08-25 DIAGNOSIS — F419 Anxiety disorder, unspecified: Secondary | ICD-10-CM

## 2022-08-25 DIAGNOSIS — F330 Major depressive disorder, recurrent, mild: Secondary | ICD-10-CM | POA: Diagnosis not present

## 2022-08-25 DIAGNOSIS — Z0000 Encounter for general adult medical examination without abnormal findings: Secondary | ICD-10-CM

## 2022-08-25 DIAGNOSIS — Z136 Encounter for screening for cardiovascular disorders: Secondary | ICD-10-CM | POA: Diagnosis not present

## 2022-08-25 DIAGNOSIS — M797 Fibromyalgia: Secondary | ICD-10-CM

## 2022-08-25 DIAGNOSIS — Z113 Encounter for screening for infections with a predominantly sexual mode of transmission: Secondary | ICD-10-CM

## 2022-08-25 MED ORDER — HYDROXYZINE HCL 25 MG PO TABS: 25.0000 mg | ORAL_TABLET | Freq: Three times a day (TID) | ORAL | 1 refills | Status: AC | PRN

## 2022-08-25 MED ORDER — SUMATRIPTAN SUCCINATE 50 MG PO TABS: ORAL_TABLET | ORAL | 1 refills | Status: AC

## 2022-08-25 MED ORDER — OMEPRAZOLE 40 MG PO CPDR: 40.0000 mg | DELAYED_RELEASE_CAPSULE | Freq: Every day | ORAL | 1 refills | Status: AC

## 2022-08-25 MED ORDER — GABAPENTIN 600 MG PO TABS: 600.0000 mg | ORAL_TABLET | Freq: Three times a day (TID) | ORAL | 1 refills | Status: AC

## 2022-08-25 MED ORDER — BUSPIRONE HCL 5 MG PO TABS: 5.0000 mg | ORAL_TABLET | Freq: Three times a day (TID) | ORAL | 1 refills | Status: AC | PRN

## 2022-08-25 MED ORDER — METHOCARBAMOL 500 MG PO TABS: 500.0000 mg | ORAL_TABLET | Freq: Three times a day (TID) | ORAL | 1 refills | Status: AC

## 2022-08-25 NOTE — Progress Notes (Signed)
BP 134/87   Pulse 65   Temp 98.9 F (37.2 C) (Oral)   Wt 124 lb (56.2 kg)   SpO2 98%   BMI 25.04 kg/m    Subjective:    [name], female    DOB: 1970-06-21, 52 y.o.   MRN: [id number]  HPI: Heather Costa is a 52 y.o. female presenting on 08/25/2022 for comprehensive medical examination. Current medical complaints include: stressed  She currently lives with: Menopausal Symptoms: no.  DEPRESSION/ANXIETY Patient states she has been more stressed lately.  She is using the Buspar as needed.  She is going to start taking it daily to see if it helps with the symptoms.    Depression Screen done today and results listed below:     08/25/2022    4:28 PM 05/24/2022    3:58 PM 10/11/2021    1:32 PM 07/16/2021    8:14 AM 06/18/2021   11:29 AM  Depression screen PHQ 2/9  Decreased Interest 0 0 1 2 1   Down, Depressed, Hopeless 0 2 1 2 1   PHQ - 2 Score 0 2 2 4 2   Altered sleeping 3 3 3 2 1   Tired, decreased energy 1 3 3 2 1   Change in appetite 1 1 1 2 1   Feeling bad or failure about yourself  0 0 1 1 0  Trouble concentrating 1 1 1 2 1   Moving slowly or fidgety/restless 0 0 0 1 1  Suicidal thoughts 0 0 0 0 0  PHQ-9 Score 6 10 11 14 7   Difficult doing work/chores  Somewhat difficult Somewhat difficult Somewhat difficult Somewhat difficult    The patient does not have a history of falls. I did complete a risk assessment for falls. A plan of care for falls was documented.   Past Medical History:  Past Medical History:  Diagnosis Date   Allergy    Anxiety    Depression    Depression with anxiety    Family history of adverse reaction to anesthesia    Father - slow to wake   Fibromyalgia    GERD (gastroesophageal reflux disease)    Migraine headache    1-2x/mo   Neuromuscular disorder [facility])     Surgical History:  Past Surgical History:  Procedure Laterality Date   COLONOSCOPY WITH PROPOFOL N/A 09/30/2019   Procedure: COLONOSCOPY WITH PROPOFOL, POLYPECTOMY;   Surgeon: Midge Minium, MD;  Location: [facility];  Service: Endoscopy;  Laterality: N/A;   Dysplasia Procedures     TUBAL LIGATION      Medications:  Current Outpatient Medications on File Prior to Visit  Medication Sig   diclofenac Sodium (VOLTAREN) 1 % GEL Apply 4 g topically 4 (four) times daily.   No current facility-administered medications on file prior to visit.    Allergies:  Allergies  Allergen Reactions   Sulfa Antibiotics Anaphylaxis    Social History:  Social History   Socioeconomic History   Marital status: Married    Spouse name: Not on file   Number of children: Not on file   Years of education: Not on file   Highest education level: Not on file  Occupational History   Not on file  Tobacco Use   Smoking status: Every Day    Packs/day: 0.50    Years: 30.00    Additional pack years: 0.00    Total pack years: 15.00    Types: Cigarettes   Smokeless tobacco: Never   Tobacco comments:  since age 46  Vaping Use   Vaping Use: Never used  Substance and Sexual Activity   Alcohol use: Not Currently    Comment: on occasion   Drug use: No   Sexual activity: Yes  Other Topics Concern   Not on file  Social History Narrative   Not on file   Social Determinants of Health   Financial Resource Strain: Not on file  Food Insecurity: Not on file  Transportation Needs: Not on file  Physical Activity: Not on file  Stress: Not on file  Social Connections: Not on file  Intimate Partner Violence: Not on file   Social History   Tobacco Use  Smoking Status Every Day   Packs/day: 0.50   Years: 30.00   Additional pack years: 0.00   Total pack years: 15.00   Types: Cigarettes  Smokeless Tobacco Never  Tobacco Comments   since age 20   Social History   Substance and Sexual Activity  Alcohol Use Not Currently   Comment: on occasion    Family History:  Family History  Problem Relation Age of Onset   Healthy Mother    Healthy Father     Cervical cancer Daughter    Leukemia Paternal Uncle    Emphysema Paternal Grandmother    Emphysema Paternal Grandfather     Past medical history, surgical history, medications, allergies, family history and social history reviewed with patient today and changes made to appropriate areas of the chart.   Review of Systems  Psychiatric/Behavioral:  Positive for depression. Negative for suicidal ideas. The patient is nervous/anxious.    All other ROS negative except what is listed above and in the HPI.      Objective:    BP 134/87   Pulse 65   Temp 98.9 F (37.2 C) (Oral)   Wt 124 lb (56.2 kg)   SpO2 98%   BMI 25.04 kg/m   Wt Readings from Last 3 Encounters:  08/25/22 124 lb (56.2 kg)  05/24/22 127 lb 3.2 oz (57.7 kg)  10/11/21 122 lb 12.8 oz (55.7 kg)    Physical Exam Vitals and nursing note reviewed.  Constitutional:      General: She is awake. She is not in acute distress.    Appearance: Normal appearance. She is well-developed. She is not ill-appearing.  HENT:     Head: Normocephalic and atraumatic.     Right Ear: Hearing, tympanic membrane, ear canal and external ear normal. No drainage.     Left Ear: Hearing, tympanic membrane, ear canal and external ear normal. No drainage.     Nose: Nose normal.     Right Sinus: No maxillary sinus tenderness or frontal sinus tenderness.     Left Sinus: No maxillary sinus tenderness or frontal sinus tenderness.     Mouth/Throat:     Mouth: Mucous membranes are moist.     Pharynx: Oropharynx is clear. Uvula midline. No pharyngeal swelling, oropharyngeal exudate or posterior oropharyngeal erythema.  Eyes:     General: Lids are normal.        Right eye: No discharge.        Left eye: No discharge.     Extraocular Movements: Extraocular movements intact.     Conjunctiva/sclera: Conjunctivae normal.     Pupils: Pupils are equal, round, and reactive to light.     Visual Fields: Right eye visual fields normal and left eye visual fields  normal.  Neck:     Thyroid: No thyromegaly.  Vascular: No carotid bruit.     Trachea: Trachea normal.  Cardiovascular:     Rate and Rhythm: Normal rate and regular rhythm.     Heart sounds: Normal heart sounds. No murmur heard.    No gallop.  Pulmonary:     Effort: Pulmonary effort is normal. No accessory muscle usage or respiratory distress.     Breath sounds: Normal breath sounds.  Chest:  Breasts:    Right: Normal.     Left: Normal.  Abdominal:     General: Bowel sounds are normal.     Palpations: Abdomen is soft. There is no hepatomegaly or splenomegaly.     Tenderness: There is no abdominal tenderness.  Musculoskeletal:        General: Normal range of motion.     Cervical back: Normal range of motion and neck supple.     Right lower leg: No edema.     Left lower leg: No edema.  Lymphadenopathy:     Head:     Right side of head: No submental, submandibular, tonsillar, preauricular or posterior auricular adenopathy.     Left side of head: No submental, submandibular, tonsillar, preauricular or posterior auricular adenopathy.     Cervical: No cervical adenopathy.     Upper Body:     Right upper body: No supraclavicular, axillary or pectoral adenopathy.     Left upper body: No supraclavicular, axillary or pectoral adenopathy.  Skin:    General: Skin is warm and dry.     Capillary Refill: Capillary refill takes less than 2 seconds.     Findings: No rash.  Neurological:     Mental Status: She is alert and oriented to person, place, and time.     Gait: Gait is intact.  Psychiatric:        Attention and Perception: Attention normal.        Mood and Affect: Mood normal.        Speech: Speech normal.        Behavior: Behavior normal. Behavior is cooperative.        Thought Content: Thought content normal.        Judgment: Judgment normal.     Results for orders placed or performed in visit on 08/25/22  Microscopic Examination   Urine  Result Value Ref Range   WBC,  UA 0-5 0 - 5 /hpf   RBC, Urine 0-2 0 - 2 /hpf   Epithelial Cells (non renal) 0-10 0 - 10 /hpf   Bacteria, UA None seen None seen/Few  CBC with Differential/Platelet  Result Value Ref Range   WBC 6.7 3.4 - 10.8 x10E3/uL   RBC 4.71 3.77 - 5.28 x10E6/uL   Hemoglobin 15.1 11.1 - 15.9 g/dL   Hematocrit 09.6 04.5 - 46.6 %   MCV 97 79 - 97 fL   MCH 32.1 26.6 - 33.0 pg   MCHC 33.1 31.5 - 35.7 g/dL   RDW 40.9 81.1 - 91.4 %   Platelets 285 150 - 450 x10E3/uL   Neutrophils 55 Not Estab. %   Lymphs 36 Not Estab. %   Monocytes 7 Not Estab. %   Eos 1 Not Estab. %   Basos 1 Not Estab. %   Neutrophils Absolute 3.7 1.4 - 7.0 x10E3/uL   Lymphocytes Absolute 2.4 0.7 - 3.1 x10E3/uL   Monocytes Absolute 0.4 0.1 - 0.9 x10E3/uL   EOS (ABSOLUTE) 0.1 0.0 - 0.4 x10E3/uL   Basophils Absolute 0.0 0.0 - 0.2 x10E3/uL   Immature  Granulocytes 0 Not Estab. %   Immature Grans (Abs) 0.0 0.0 - 0.1 x10E3/uL  Comprehensive metabolic panel  Result Value Ref Range   Glucose 87 70 - 99 mg/dL   BUN 16 6 - 24 mg/dL   Creatinine, Ser 3.87 (L) 0.57 - 1.00 mg/dL   eGFR 564 >33 IR/JJO/8.41   BUN/Creatinine Ratio 29 (H) 9 - 23   Sodium 136 134 - 144 mmol/L   Potassium 4.4 3.5 - 5.2 mmol/L   Chloride 98 96 - 106 mmol/L   CO2 22 20 - 29 mmol/L   Calcium 10.0 8.7 - 10.2 mg/dL   Total Protein 7.1 6.0 - 8.5 g/dL   Albumin 4.8 3.8 - 4.9 g/dL   Globulin, Total 2.3 1.5 - 4.5 g/dL   Albumin/Globulin Ratio 2.1 1.2 - 2.2   Bilirubin Total <0.2 0.0 - 1.2 mg/dL   Alkaline Phosphatase 59 44 - 121 IU/L   AST 17 0 - 40 IU/L   ALT 15 0 - 32 IU/L  Lipid panel  Result Value Ref Range   Cholesterol, Total 204 (H) 100 - 199 mg/dL   Triglycerides 660 0 - 149 mg/dL   HDL 97 >63 mg/dL   VLDL Cholesterol Cal 24 5 - 40 mg/dL   LDL Chol Calc (NIH) 83 0 - 99 mg/dL   Chol/HDL Ratio 2.1 0.0 - 4.4 ratio  TSH  Result Value Ref Range   TSH 2.170 0.450 - 4.500 uIU/mL  Urinalysis, Routine w reflex microscopic  Result Value Ref Range    Specific Gravity, UA 1.010 1.005 - 1.030   pH, UA 5.0 5.0 - 7.5   Color, UA Yellow Yellow   Appearance Ur Clear Clear   Leukocytes,UA Negative Negative   Protein,UA Negative Negative/Trace   Glucose, UA Negative Negative   Ketones, UA Negative Negative   RBC, UA Trace (A) Negative   Bilirubin, UA Negative Negative   Urobilinogen, Ur 0.2 0.2 - 1.0 mg/dL   Nitrite, UA Negative Negative   Microscopic Examination See below:       Assessment & Plan:   Problem List Items Addressed This Visit       Other   Fibromyalgia    Chronic. Doing well on current regimen.  Usually takes medications PRN for flares. Not taking anything daily.      Relevant Medications   gabapentin (NEURONTIN) 600 MG tablet   methocarbamol (ROBAXIN) 500 MG tablet   Anxiety    Chronic. Doing okay.  Would like to change the Buspar from PRN to taking it BID.  Discussed with patient during visit today. Follow up in 1 month.  Call sooner if concerns arise.       Relevant Medications   busPIRone (BUSPAR) 5 MG tablet   hydrOXYzine (ATARAX) 25 MG tablet   Mild episode of recurrent major depressive disorder (HCC)    Chronic. Doing okay.  Would like to change the Buspar from PRN to taking it BID.  Discussed with patient during visit today. Follow up in 1 month.  Call sooner if concerns arise.       Relevant Medications   busPIRone (BUSPAR) 5 MG tablet   hydrOXYzine (ATARAX) 25 MG tablet   Other Visit Diagnoses     Annual physical exam    -  Primary   Health maintenance reviewed during visit today.  Labs ordered.  Reviewed vaccines.  Colon screening up to date.  PAP up to date. Mammogram ordered.   Relevant Orders   CBC with Differential/Platelet (  Completed)   Comprehensive metabolic panel (Completed)   Lipid panel (Completed)   TSH (Completed)   Urinalysis, Routine w reflex microscopic (Completed)   Screening for ischemic heart disease       Relevant Orders   Lipid panel (Completed)   Screening examination  for STI       Relevant Orders   Chlamydia/Gonococcus/Trichomonas, NAA([facility])        Follow up plan: Return in about 6 months (around 02/25/2023) for HTN, HLD, DM2 FU.   LABORATORY TESTING:  - Pap smear: pap done  IMMUNIZATIONS:   - Tdap: Tetanus vaccination status reviewed: last tetanus booster within 10 years. - Influenza: Postponed to flu season - Pneumovax: Not applicable - Prevnar: Not applicable - COVID: Not applicable - HPV: Not applicable - Shingrix vaccine:  will hold off  SCREENING: -Mammogram: Ordered today  - Colonoscopy: Up to date  - Bone Density: Not applicable  -Hearing Test: Not applicable  -Spirometry: Not applicable   PATIENT COUNSELING:   Advised to take 1 mg of folate supplement per day if capable of pregnancy.   Sexuality: Discussed sexually transmitted diseases, partner selection, use of condoms, avoidance of unintended pregnancy  and contraceptive alternatives.   Advised to avoid cigarette smoking.  I discussed with the patient that most people either abstain from alcohol or drink within safe limits (<=14/week and <=4 drinks/occasion for males, <=7/weeks and <= 3 drinks/occasion for females) and that the risk for alcohol disorders and other health effects rises proportionally with the number of drinks per week and how often a drinker exceeds daily limits.  Discussed cessation/primary prevention of drug use and availability of treatment for abuse.   Diet: Encouraged to adjust caloric intake to maintain  or achieve ideal body weight, to reduce intake of dietary saturated fat and total fat, to limit sodium intake by avoiding high sodium foods and not adding table salt, and to maintain adequate dietary potassium and calcium preferably from fresh fruits, vegetables, and low-fat dairy products.    stressed the importance of regular exercise  Injury prevention: Discussed safety belts, safety helmets, smoke detector, smoking near bedding or upholstery.    Dental health: Discussed importance of regular tooth brushing, flossing, and dental visits.    NEXT PREVENTATIVE PHYSICAL DUE IN 1 YEAR. Return in about 6 months (around 02/25/2023) for HTN, HLD, DM2 FU.

## 2022-08-26 ENCOUNTER — Encounter: Payer: Self-pay | Admitting: Nurse Practitioner

## 2022-08-26 LAB — COMPREHENSIVE METABOLIC PANEL
ALT: 15 IU/L (ref 0–32)
AST: 17 IU/L (ref 0–40)
Albumin/Globulin Ratio: 2.1 (ref 1.2–2.2)
Albumin: 4.8 g/dL (ref 3.8–4.9)
Alkaline Phosphatase: 59 IU/L (ref 44–121)
BUN/Creatinine Ratio: 29 — ABNORMAL HIGH (ref 9–23)
BUN: 16 mg/dL (ref 6–24)
Bilirubin Total: <0.2 mg/dL (ref 0.0–1.2)
CO2: 22 mmol/L (ref 20–29)
Calcium: 10 mg/dL (ref 8.7–10.2)
Chloride: 98 mmol/L (ref 96–106)
Creatinine, Ser: 0.56 mg/dL — ABNORMAL LOW (ref 0.57–1.00)
Globulin, Total: 2.3 g/dL (ref 1.5–4.5)
Glucose: 87 mg/dL (ref 70–99)
Potassium: 4.4 mmol/L (ref 3.5–5.2)
Sodium: 136 mmol/L (ref 134–144)
Total Protein: 7.1 g/dL (ref 6.0–8.5)
eGFR: 109 mL/min/{1.73_m2} (ref >59)

## 2022-08-26 LAB — URINALYSIS, ROUTINE W REFLEX MICROSCOPIC
Bilirubin, UA: NEGATIVE
Glucose, UA: NEGATIVE
Ketones, UA: NEGATIVE
Leukocytes,UA: NEGATIVE
Nitrite, UA: NEGATIVE
Protein,UA: NEGATIVE
Specific Gravity, UA: 1.01 (ref 1.005–1.030)
Urobilinogen, Ur: 0.2 mg/dL (ref 0.2–1.0)
pH, UA: 5 (ref 5.0–7.5)

## 2022-08-26 LAB — CBC WITH DIFFERENTIAL/PLATELET
Basophils Absolute: 0 10*3/uL (ref 0.0–0.2)
Basos: 1 %
EOS (ABSOLUTE): 0.1 10*3/uL (ref 0.0–0.4)
Eos: 1 %
Hematocrit: 45.6 % (ref 34.0–46.6)
Hemoglobin: 15.1 g/dL (ref 11.1–15.9)
Immature Grans (Abs): 0 10*3/uL (ref 0.0–0.1)
Immature Granulocytes: 0 %
Lymphocytes Absolute: 2.4 10*3/uL (ref 0.7–3.1)
Lymphs: 36 %
MCH: 32.1 pg (ref 26.6–33.0)
MCHC: 33.1 g/dL (ref 31.5–35.7)
MCV: 97 fL (ref 79–97)
Monocytes Absolute: 0.4 10*3/uL (ref 0.1–0.9)
Monocytes: 7 %
Neutrophils Absolute: 3.7 10*3/uL (ref 1.4–7.0)
Neutrophils: 55 %
Platelets: 285 10*3/uL (ref 150–450)
RBC: 4.71 x10E6/uL (ref 3.77–5.28)
RDW: 11.8 % (ref 11.7–15.4)
WBC: 6.7 10*3/uL (ref 3.4–10.8)

## 2022-08-26 LAB — TSH: TSH: 2.17 u[IU]/mL (ref 0.450–4.500)

## 2022-08-26 LAB — LIPID PANEL
Chol/HDL Ratio: 2.1 ratio (ref 0.0–4.4)
Cholesterol, Total: 204 mg/dL — ABNORMAL HIGH (ref 100–199)
HDL: 97 mg/dL (ref >39)
LDL Chol Calc (NIH): 83 mg/dL (ref 0–99)
Triglycerides: 145 mg/dL (ref 0–149)
VLDL Cholesterol Cal: 24 mg/dL (ref 5–40)

## 2022-08-26 LAB — MICROSCOPIC EXAMINATION: Bacteria, UA: NONE SEEN

## 2022-08-26 NOTE — Assessment & Plan Note (Signed)
Chronic. Doing okay.  Would like to change the Buspar from PRN to taking it BID.  Discussed with patient during visit today. Follow up in 1 month.  Call sooner if concerns arise.

## 2022-08-26 NOTE — Progress Notes (Signed)
HI Jayda. It was nice to see you yesterday.  Your lab work looks good.  No concerns at this time. Continue with your current medication regimen.  Follow up as discussed.  Please let me know if you have any questions.

## 2022-08-26 NOTE — Assessment & Plan Note (Signed)
Chronic. Doing well on current regimen.  Usually takes medications PRN for flares. Not taking anything daily.

## 2022-08-26 NOTE — Assessment & Plan Note (Signed)
Chronic. Doing okay.  Would like to change the Buspar from PRN to taking it BID.  Discussed with patient during visit today. Follow up in 1 month.  Call sooner if concerns arise.  

## 2022-08-28 LAB — CHLAMYDIA/GONOCOCCUS/TRICHOMONAS, NAA
Chlamydia by NAA: NEGATIVE
Gonococcus by NAA: NEGATIVE
Trich vag by NAA: POSITIVE — AB

## 2022-08-29 MED ORDER — METRONIDAZOLE 500 MG PO TABS: 500.0000 mg | ORAL_TABLET | Freq: Two times a day (BID) | ORAL | 0 refills | Status: AC

## 2022-08-29 NOTE — Progress Notes (Signed)
Hi Heather Costa.  You are still positive for [place].  I have sent in another course of Flagyl to treat this.

## 2022-08-29 NOTE — Addendum Note (Signed)
Addended by: Larae Grooms on: 08/29/2022 08:04 AM   Modules accepted: Orders

## 2022-09-21 ENCOUNTER — Ambulatory Visit
Admission: RE | Admit: 2022-09-21 | Discharge: 2022-09-21 | Disposition: A | Discharge status: Home / Self Care | Payer: 59 | Source: Ambulatory Visit | Attending: Nurse Practitioner | Admitting: Nurse Practitioner

## 2022-09-21 DIAGNOSIS — R921 Mammographic calcification found on diagnostic imaging of breast: Secondary | ICD-10-CM | POA: Diagnosis present

## 2022-09-22 NOTE — Progress Notes (Signed)
Please let patient know her Mammogram did not show any evidence of a malignancy.  The recommendation is to repeat the Mammogram in 1 year.  

## 2022-09-26 IMAGING — CT CT MAXILLOFACIAL W/O CM
3 of 4 series · 16 of 47 positions shown, 19 images · non-contrast
Comparison: None.

CLINICAL DATA: Right orbital trauma

EXAM:
CT MAXILLOFACIAL WITHOUT CONTRAST
TECHNIQUE: Multidetector CT imaging of the maxillofacial structures was
performed. Multiplanar CT image reconstructions were also generated.

[Series 2: max soft · axial · 0.33mm/px · z∈[-128,+2]mm · 10 of 77 slices shown, 13 images]
[im 6/77  brain]
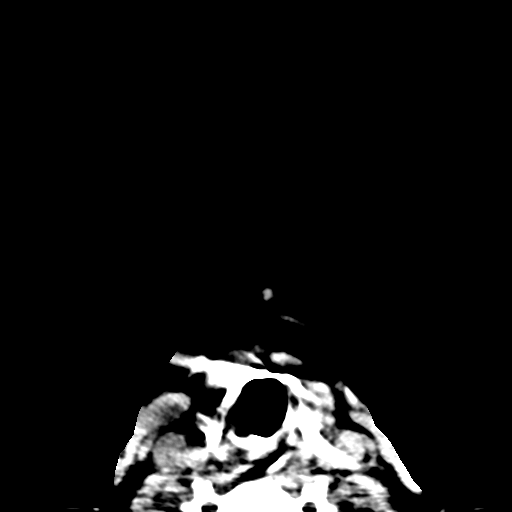
[im 6/77  bone]
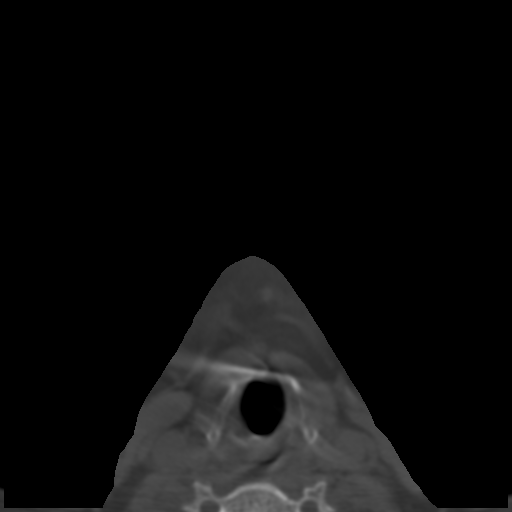
[im 14/77  bone]
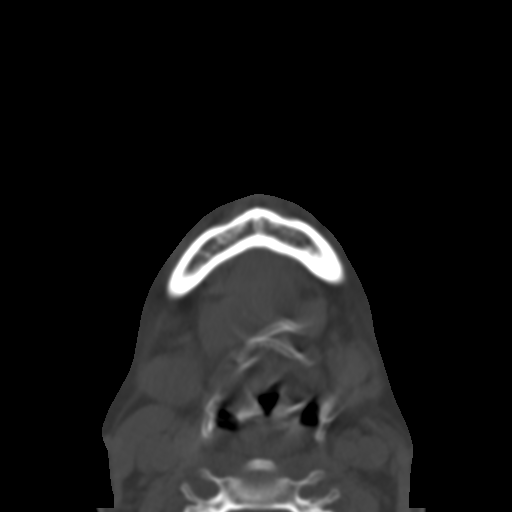
[im 21/77  bone]
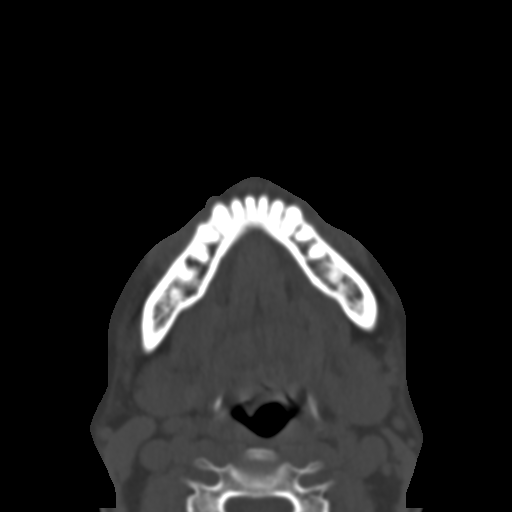
[im 27/77  bone]
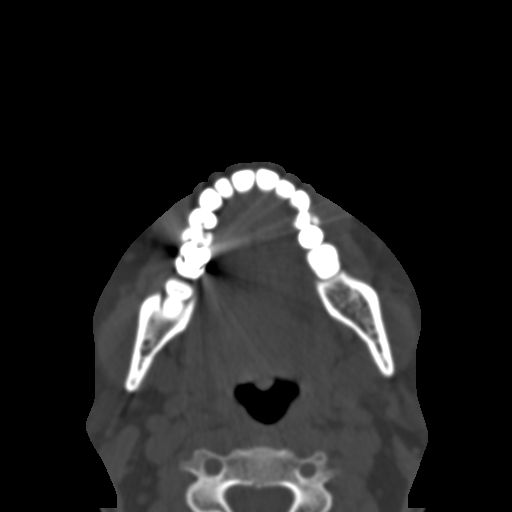
[im 35/77  brain]
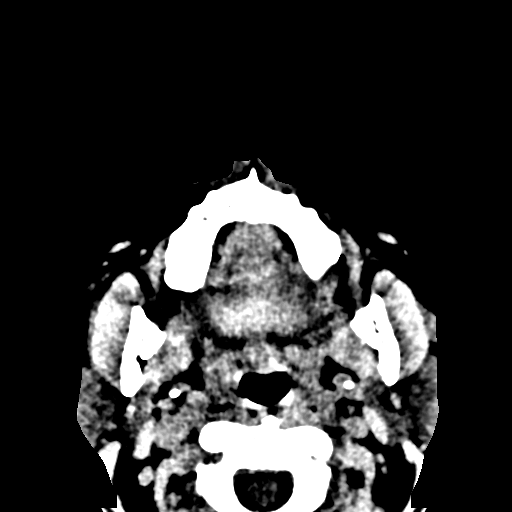
[im 35/77  bone]
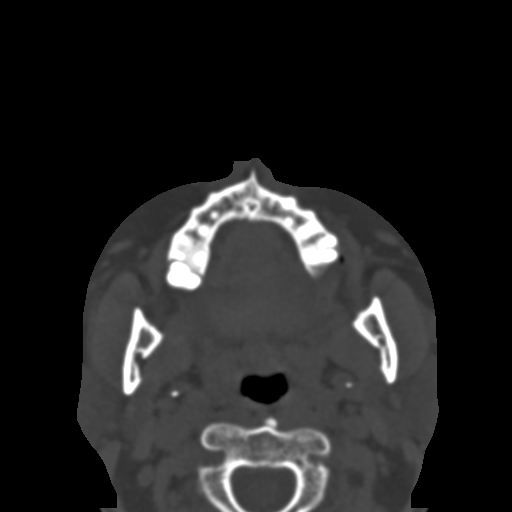
[im 42/77  bone]
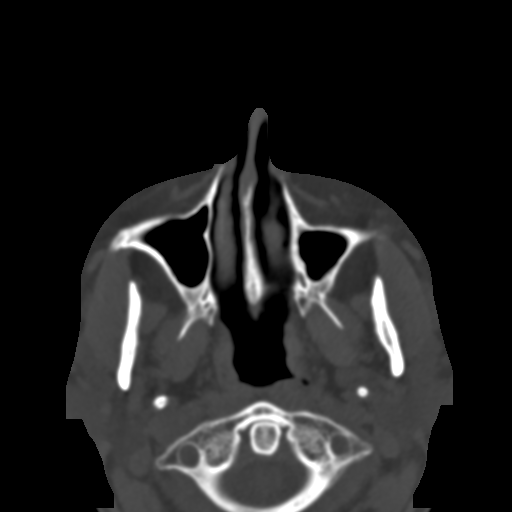
[im 50/77  bone]
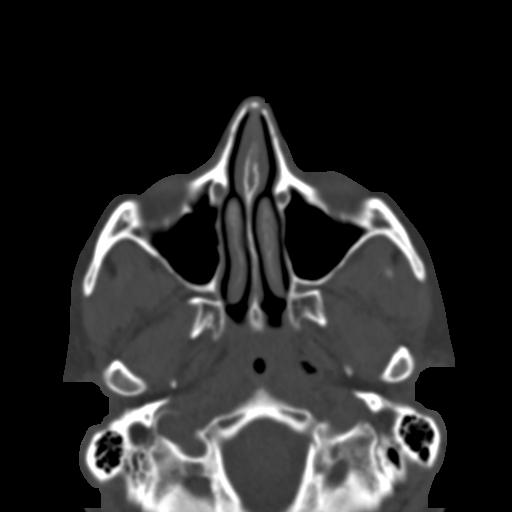
[im 58/77  bone]
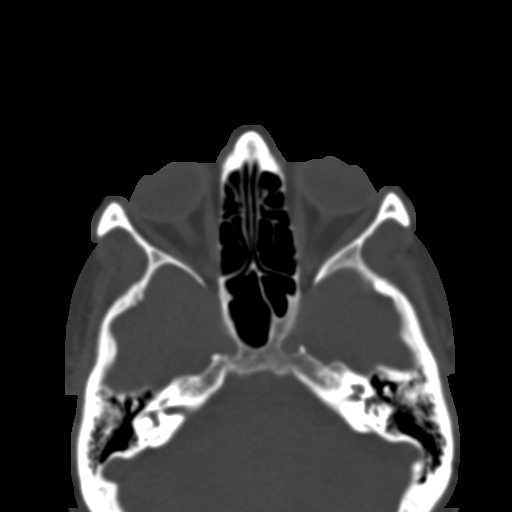
[im 63/77  brain]
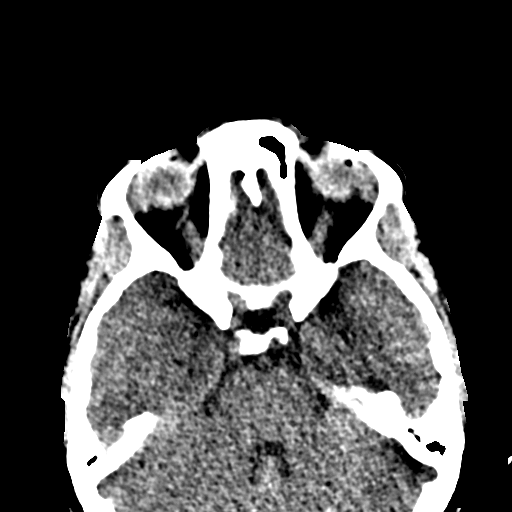
[im 63/77  bone]
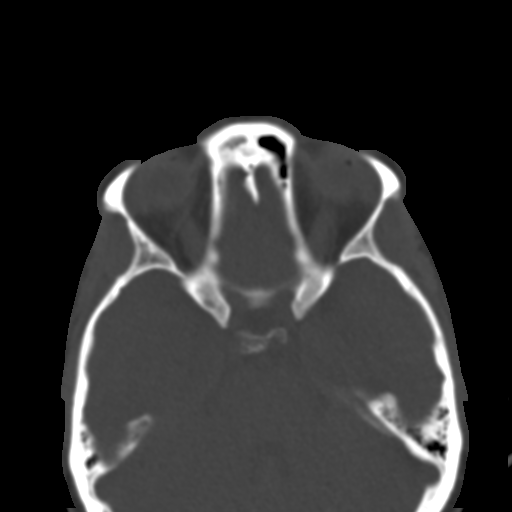
[im 71/77  bone]
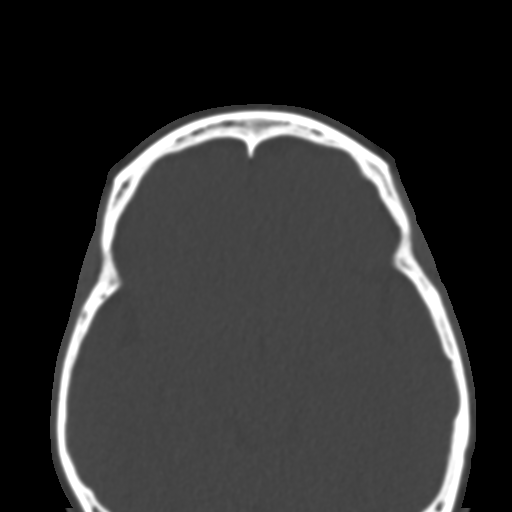

[Series 7: sagittal soft · sagittal · 0.35mm/px · 3 of 72 slices shown]
[im 24/72  bone]
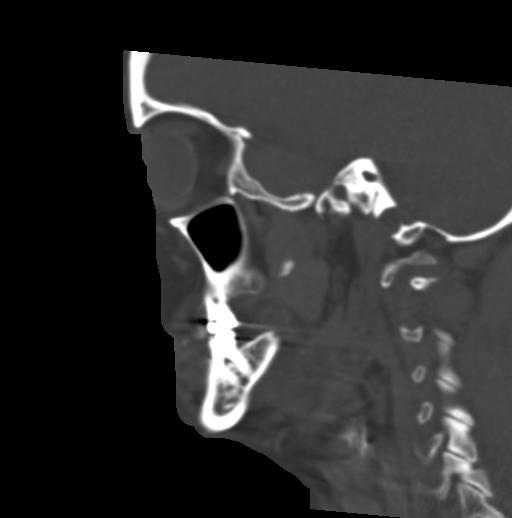
[im 36/72  bone]
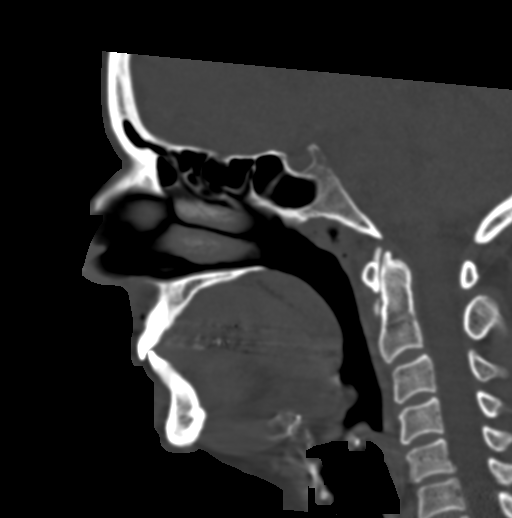
[im 48/72  bone]
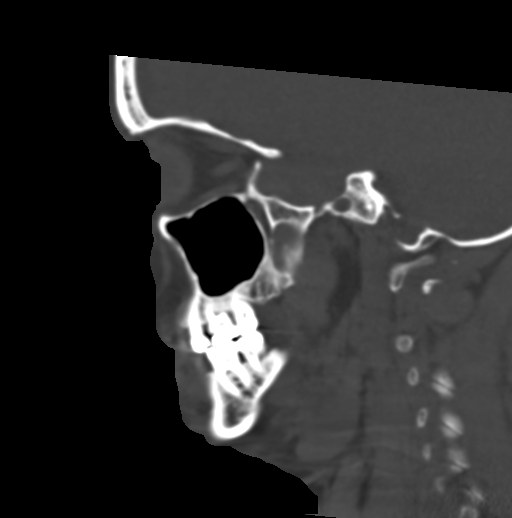

[Series 8: coronal bone · coronal · 0.33mm/px · 3 of 71 slices shown]
[im 18/71  bone]
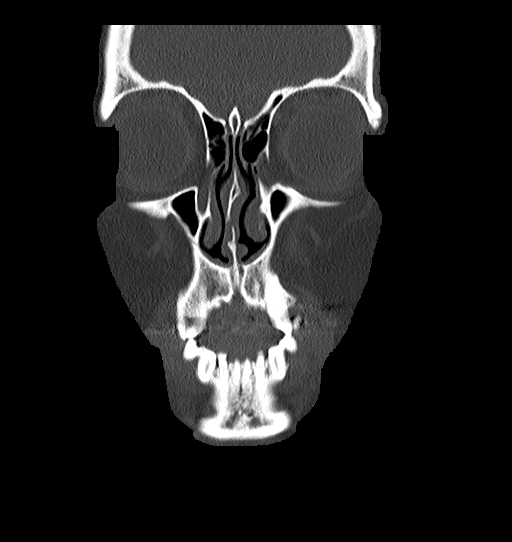
[im 36/71  bone]
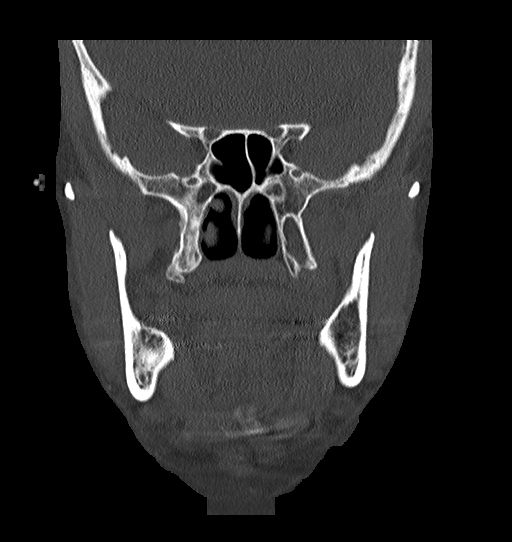
[im 53/71  bone]
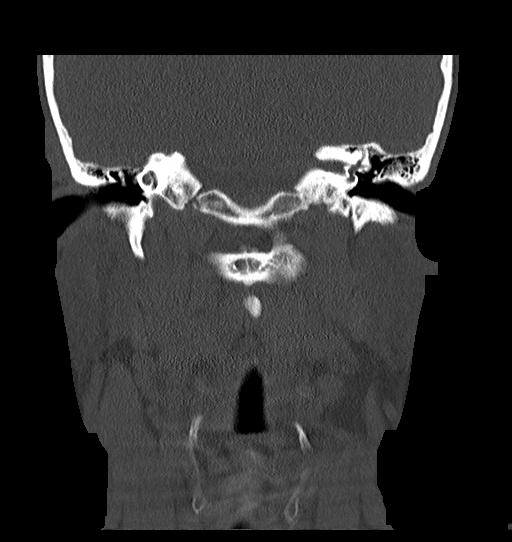

[16 of 47 positions shown; findings below may reference images not displayed]

FINDINGS: Osseous: Negative for fracture

Orbits: Normal orbit. No fracture. No orbital mass or edema. No
significant soft tissue swelling.

Sinuses: Clear bilaterally.

Soft tissues: No significant soft tissue swelling.

Limited intracranial: Negative
IMPRESSION: Negative CT maxillofacial.  Negative for fracture.

## 2023-02-21 ENCOUNTER — Encounter: Payer: Self-pay | Admitting: Nurse Practitioner

## 2023-09-04 IMAGING — CR DG RIBS W/ CHEST 3+V*R*
1 series · 3 of 3 positions shown · non-contrast
Comparison: None.

CLINICAL DATA: Mid back pain after an accident in [REDACTED].

EXAM:
RIGHT RIBS AND CHEST - 3+ VIEW

[Series 1: dg ribs unilateral w/chest right · 0.14mm/px · 3 of 3 slices shown]
[im 1/3]
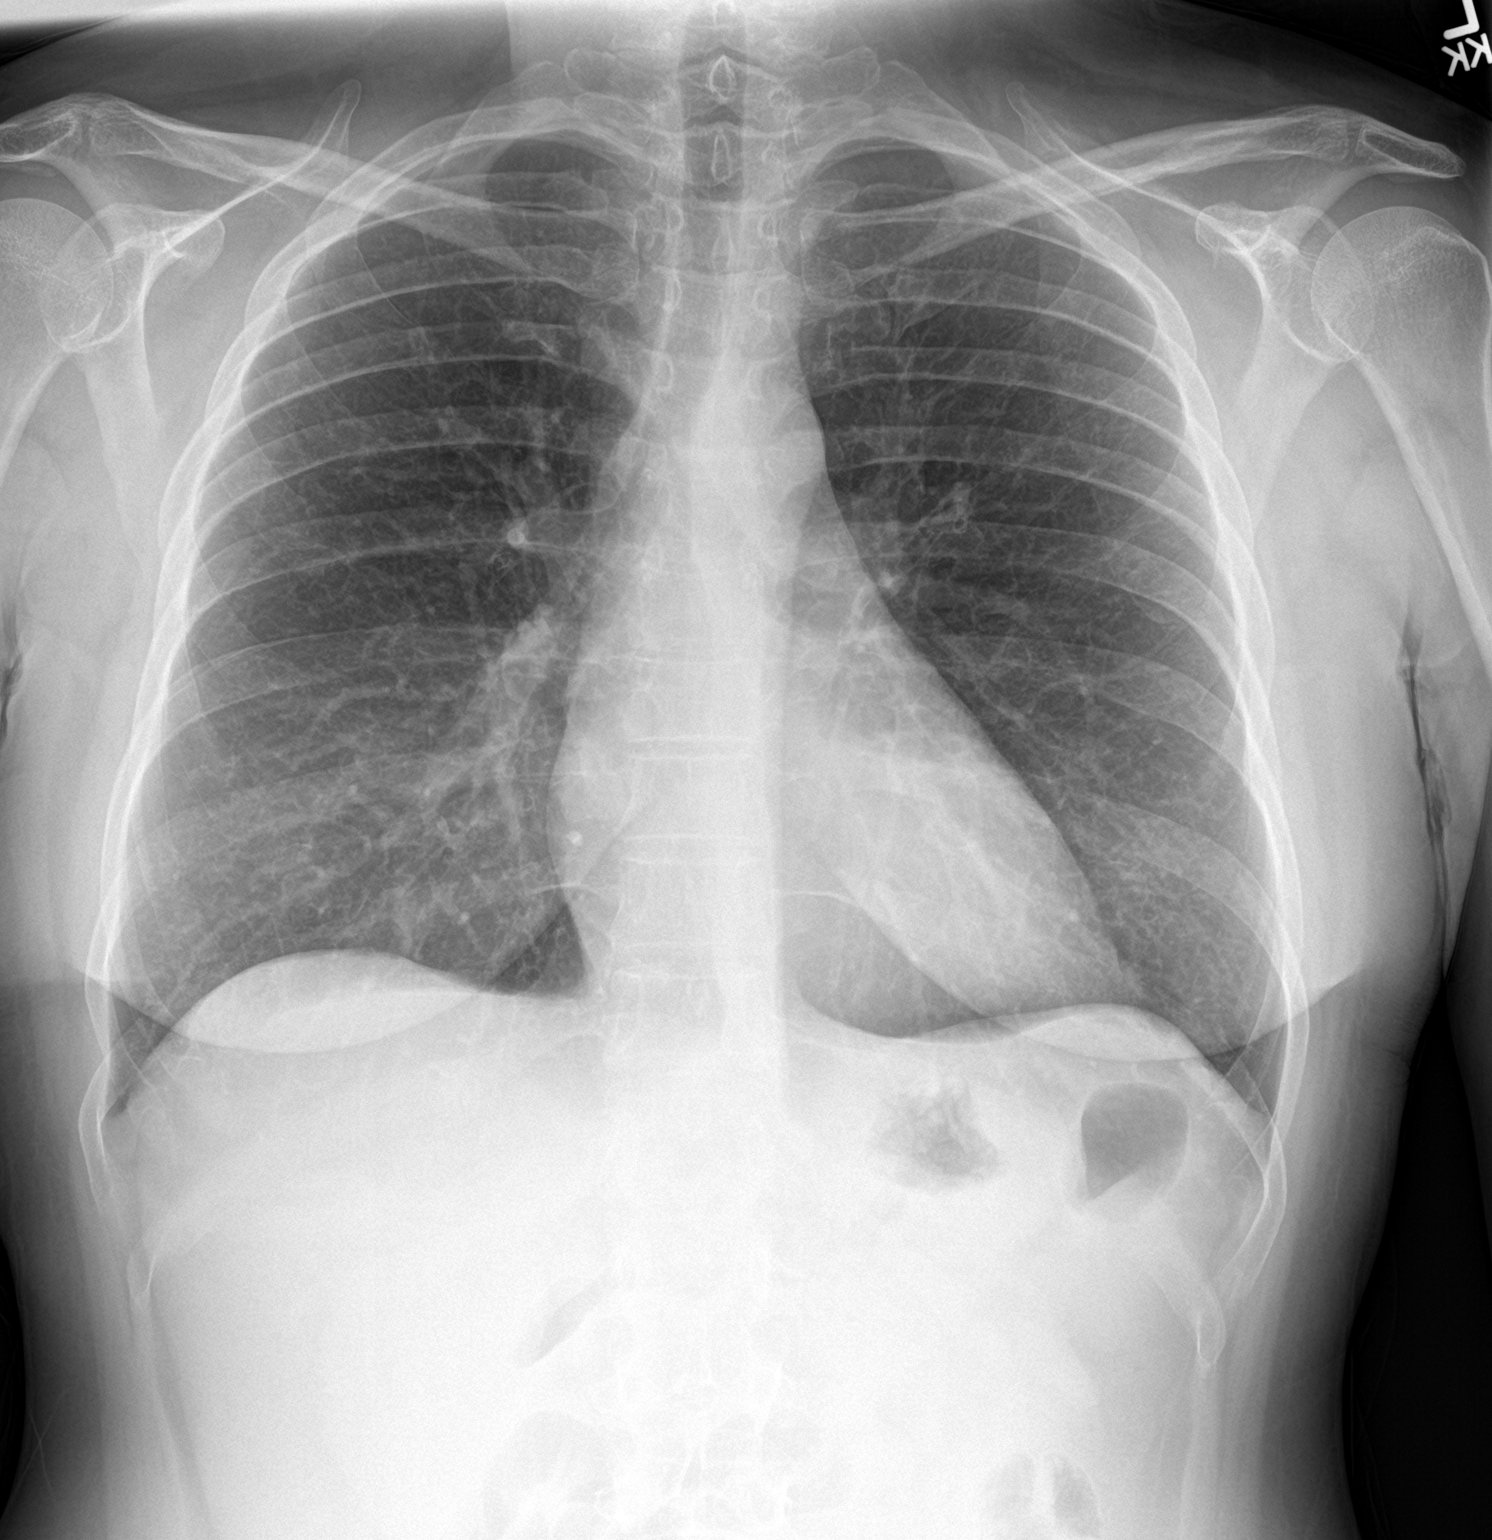
[im 2/3]
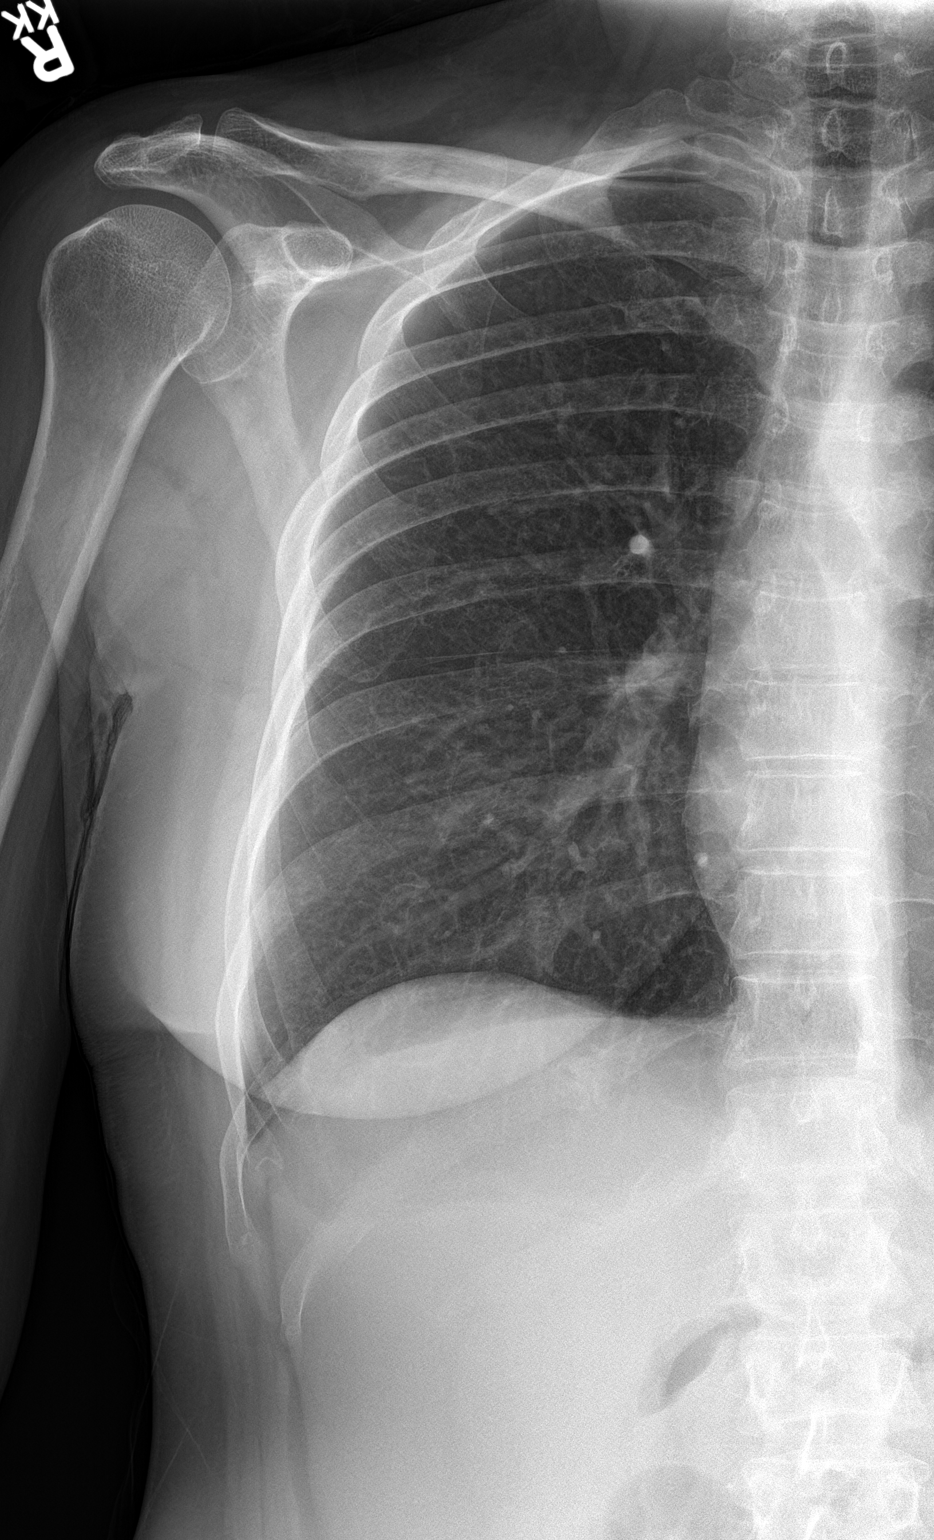
[im 3/3]
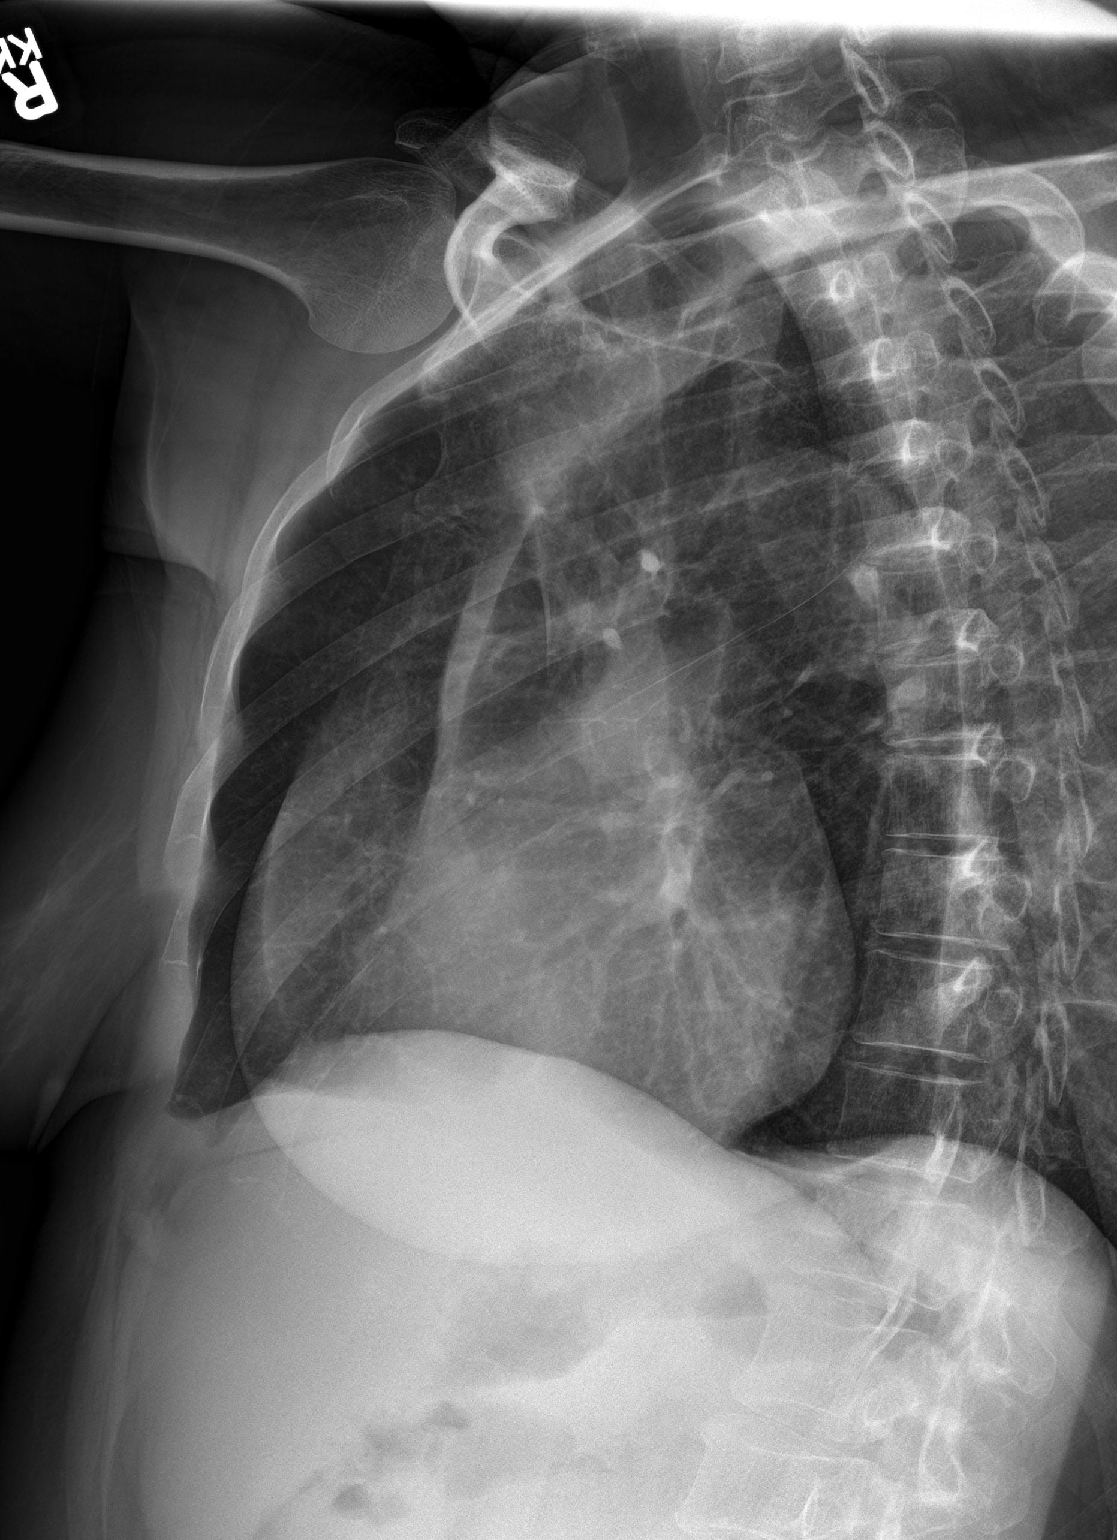

[3 of 3 positions shown; findings below may reference images not displayed]

FINDINGS: Normal heart size and pulmonary vascularity. No focal airspace
disease or consolidation in the lungs. No blunting of costophrenic
angles. No pneumothorax. Mediastinal contours appear intact.

Right ribs appear intact. No acute displaced fractures identified.
No focal bone lesion or bone destruction. Soft tissues are
unremarkable.
IMPRESSION: No evidence of active pulmonary disease.  Negative right ribs.
